# Patient Record
Sex: Female | Born: 1955 | Race: White | Hispanic: No | Smoking: Former smoker
Health system: Southern US, Community
[De-identification: ages and names within clinical notes are randomized; demographics above are authoritative.]

## PROBLEM LIST (undated history)

## (undated) DIAGNOSIS — G709 Myoneural disorder, unspecified: Secondary | ICD-10-CM

## (undated) DIAGNOSIS — K219 Gastro-esophageal reflux disease without esophagitis: Secondary | ICD-10-CM

## (undated) DIAGNOSIS — M1990 Unspecified osteoarthritis, unspecified site: Secondary | ICD-10-CM

## (undated) DIAGNOSIS — J449 Chronic obstructive pulmonary disease, unspecified: Secondary | ICD-10-CM

## (undated) DIAGNOSIS — F32A Depression, unspecified: Secondary | ICD-10-CM

## (undated) DIAGNOSIS — E119 Type 2 diabetes mellitus without complications: Secondary | ICD-10-CM

## (undated) DIAGNOSIS — I10 Essential (primary) hypertension: Secondary | ICD-10-CM

## (undated) DIAGNOSIS — Z87442 Personal history of urinary calculi: Secondary | ICD-10-CM

## (undated) DIAGNOSIS — H9190 Unspecified hearing loss, unspecified ear: Secondary | ICD-10-CM

## (undated) DIAGNOSIS — F419 Anxiety disorder, unspecified: Secondary | ICD-10-CM

## (undated) DIAGNOSIS — N189 Chronic kidney disease, unspecified: Secondary | ICD-10-CM

## (undated) DIAGNOSIS — J189 Pneumonia, unspecified organism: Secondary | ICD-10-CM

## (undated) HISTORY — PX: HERNIA REPAIR: SHX51

## (undated) HISTORY — PX: BACK SURGERY: SHX140

---

## 2019-09-26 ENCOUNTER — Emergency Department
Admission: EM | Admit: 2019-09-26 | Discharge: 2019-09-26 | Disposition: A | Discharge status: Home / Self Care | Payer: Self-pay | Attending: Emergency Medicine | Admitting: Emergency Medicine

## 2019-09-26 ENCOUNTER — Other Ambulatory Visit: Payer: Self-pay

## 2019-09-26 DIAGNOSIS — R1110 Vomiting, unspecified: Secondary | ICD-10-CM | POA: Insufficient documentation

## 2019-09-26 DIAGNOSIS — M545 Low back pain: Secondary | ICD-10-CM | POA: Insufficient documentation

## 2019-09-26 DIAGNOSIS — B9689 Other specified bacterial agents as the cause of diseases classified elsewhere: Secondary | ICD-10-CM | POA: Insufficient documentation

## 2019-09-26 DIAGNOSIS — N390 Urinary tract infection, site not specified: Secondary | ICD-10-CM | POA: Insufficient documentation

## 2019-09-26 DIAGNOSIS — Z5321 Procedure and treatment not carried out due to patient leaving prior to being seen by health care provider: Secondary | ICD-10-CM | POA: Insufficient documentation

## 2019-09-26 LAB — COMPREHENSIVE METABOLIC PANEL
ALT: 20 U/L (ref 0–44)
AST: 18 U/L (ref 15–41)
Albumin: 3 g/dL — ABNORMAL LOW (ref 3.5–5.0)
Alkaline Phosphatase: 106 U/L (ref 38–126)
Anion gap: 10 (ref 5–15)
BUN: 27 mg/dL — ABNORMAL HIGH (ref 8–23)
CO2: 23 mmol/L (ref 22–32)
Calcium: 8.7 mg/dL — ABNORMAL LOW (ref 8.9–10.3)
Chloride: 102 mmol/L (ref 98–111)
Creatinine, Ser: 1.18 mg/dL — ABNORMAL HIGH (ref 0.44–1.00)
GFR calc Af Amer: 56 mL/min — ABNORMAL LOW (ref >60)
GFR calc non Af Amer: 49 mL/min — ABNORMAL LOW (ref >60)
Glucose, Bld: 295 mg/dL — ABNORMAL HIGH (ref 70–99)
Potassium: 4.3 mmol/L (ref 3.5–5.1)
Sodium: 135 mmol/L (ref 135–145)
Total Bilirubin: 0.8 mg/dL (ref 0.3–1.2)
Total Protein: 6.3 g/dL — ABNORMAL LOW (ref 6.5–8.1)

## 2019-09-26 LAB — CBC
HCT: 39.4 % (ref 36.0–46.0)
Hemoglobin: 13.2 g/dL (ref 12.0–15.0)
MCH: 28.9 pg (ref 26.0–34.0)
MCHC: 33.5 g/dL (ref 30.0–36.0)
MCV: 86.2 fL (ref 80.0–100.0)
Platelets: 289 10*3/uL (ref 150–400)
RBC: 4.57 MIL/uL (ref 3.87–5.11)
RDW: 12.8 % (ref 11.5–15.5)
WBC: 9.6 10*3/uL (ref 4.0–10.5)
nRBC: 0 % (ref 0.0–0.2)

## 2019-09-26 NOTE — ED Triage Notes (Signed)
Pt complains of vomiting, right lower back pain for several days. Per pt's daughter she was started on a medication yesterday for a UTI and has been vomiting and "not acting like herself" since this am. Pt denies diarrhea, chest pain or shob.

## 2019-09-30 ENCOUNTER — Telehealth: Payer: Self-pay | Admitting: Emergency Medicine

## 2019-09-30 NOTE — Telephone Encounter (Signed)
Called patient due to lwot to inquire about condition and follow up plans.  Daughter says patient is doing much better.  Says she is looking better, gettign around and is not acting altered now.   No vomiting for several days now.   I told her to let her pcp know how she is doing and that labs are available.

## 2020-09-11 ENCOUNTER — Other Ambulatory Visit: Payer: Self-pay

## 2020-09-11 ENCOUNTER — Ambulatory Visit
Admission: RE | Admit: 2020-09-11 | Discharge: 2020-09-11 | Disposition: A | Discharge status: Home / Self Care | Payer: Medicare HMO | Source: Ambulatory Visit | Attending: Family Medicine | Admitting: Family Medicine

## 2020-09-11 ENCOUNTER — Other Ambulatory Visit: Payer: Self-pay | Admitting: Family Medicine

## 2020-09-11 DIAGNOSIS — K420 Umbilical hernia with obstruction, without gangrene: Secondary | ICD-10-CM | POA: Insufficient documentation

## 2020-11-02 ENCOUNTER — Other Ambulatory Visit: Payer: Self-pay | Admitting: Nephrology

## 2020-11-02 DIAGNOSIS — N183 Chronic kidney disease, stage 3 unspecified: Secondary | ICD-10-CM

## 2020-11-02 DIAGNOSIS — N2589 Other disorders resulting from impaired renal tubular function: Secondary | ICD-10-CM

## 2020-11-16 ENCOUNTER — Ambulatory Visit: Payer: Medicare HMO

## 2020-12-14 ENCOUNTER — Other Ambulatory Visit: Payer: Self-pay

## 2020-12-14 ENCOUNTER — Ambulatory Visit
Admission: RE | Admit: 2020-12-14 | Discharge: 2020-12-14 | Disposition: A | Discharge status: Home / Self Care | Payer: Medicare HMO | Source: Ambulatory Visit | Attending: Nephrology | Admitting: Nephrology

## 2020-12-14 DIAGNOSIS — N183 Chronic kidney disease, stage 3 unspecified: Secondary | ICD-10-CM | POA: Insufficient documentation

## 2020-12-14 DIAGNOSIS — N2589 Other disorders resulting from impaired renal tubular function: Secondary | ICD-10-CM | POA: Diagnosis present

## 2021-01-21 ENCOUNTER — Other Ambulatory Visit: Payer: Self-pay | Admitting: Orthopedic Surgery

## 2021-01-21 DIAGNOSIS — S42291K Other displaced fracture of upper end of right humerus, subsequent encounter for fracture with nonunion: Secondary | ICD-10-CM

## 2021-02-11 ENCOUNTER — Other Ambulatory Visit: Payer: Medicare HMO

## 2021-02-16 ENCOUNTER — Other Ambulatory Visit: Payer: Medicare HMO

## 2021-03-24 ENCOUNTER — Other Ambulatory Visit: Payer: Self-pay | Admitting: Orthopedic Surgery

## 2021-03-24 DIAGNOSIS — M19011 Primary osteoarthritis, right shoulder: Secondary | ICD-10-CM

## 2021-04-02 ENCOUNTER — Ambulatory Visit
Admission: RE | Admit: 2021-04-02 | Discharge: 2021-04-02 | Disposition: A | Discharge status: Home / Self Care | Payer: Medicare HMO | Source: Ambulatory Visit | Attending: Orthopedic Surgery | Admitting: Orthopedic Surgery

## 2021-04-02 ENCOUNTER — Other Ambulatory Visit: Payer: Self-pay

## 2021-04-02 DIAGNOSIS — M19011 Primary osteoarthritis, right shoulder: Secondary | ICD-10-CM

## 2021-04-16 NOTE — Patient Instructions (Addendum)
DUE TO COVID-19 ONLY ONE VISITOR IS ALLOWED TO COME WITH YOU AND STAY IN THE WAITING ROOM ONLY DURING PRE OP AND PROCEDURE.   **NO VISITORS ARE ALLOWED IN THE SHORT STAY AREA OR RECOVERY ROOM!!**  IF YOU WILL BE ADMITTED INTO THE HOSPITAL YOU ARE ALLOWED ONLY TWO SUPPORT PEOPLE DURING VISITATION HOURS ONLY (7 AM -8PM)   The support person(s) must pass our screening, gel in and out, and wear a mask at all times, including in the patients room. Patients must also wear a mask when staff or their support person are in the room. Visitors GUEST BADGE MUST BE WORN VISIBLY  One adult visitor may remain with you overnight and MUST be in the room by 8 P.M.  No visitors under the age of 66. Any visitor under the age of 66 must be accompanied by an adult.    You are not required to quarantine, however you are required to wear a well-fitted mask when you are out and around people not in your household.  Hand Hygiene often Do NOT share personal items Notify your provider if you are in close contact with someone who has COVID or you develop fever 100.4 or greater, new onset of sneezing, cough, sore throat, shortness of breath or body aches.        Your procedure is scheduled on: 05-08-22   [facility] to [facility] Main En[facility]    Report to admitting at      0600 AM   Call this number if you have problems the morning of surgery [phone]   Do not eat food :After Midnight.   After Midnight you may have the following liquids until    0545______ AM DAY OF SURGERY  Water Black Coffee (sugar ok, NO MILK/CREAM OR CREAMERS)  Tea (sugar ok, NO MILK/CREAM OR CREAMERS) regular and decaf                             Plain Jell-O (NO RED)                                           Fruit ices (not with fruit pulp, NO RED)                                     Popsicles (NO RED)                                                                  Juice: apple, WHITE grape, WHITE cranberry Sports drinks like  Gatorade (NO RED) Clear broth(vegetable,chicken,beef)                   The day of surgery:  Drink ONE (1) Pre-Surgery G2 at       0545 AM the morning of surgery. Drink in one sitting. Do not sip.  This drink was given to you during your hospital  pre-op appointment visit. Nothing else to drink after completing the  Pre-Surgery Clear Ensure or G2.  If you have questions, please contact your surgeons office.       Oral Hygiene is also important to reduce your risk of infection.                                    Remember - BRUSH YOUR TEETH THE MORNING OF SURGERY WITH YOUR REGULAR TOOTHPASTE   Do NOT smoke after Midnight   Take these medicines the morning of surgery with A SIP OF WATER: Amlodipine, inhalers and bring them with you, carvedilol, citalopram, duloxentine, gabapentin,   DO NOT TAKE ANY ORAL DIABETIC MEDICATIONS DAY OF YOUR SURGERY  Bring CPAP mask and tubing day of surgery.                              You may not have any metal on your body including hair pins, jewelry, and body piercing             Do not wear make-up, lotions, powders, perfumes/cologne, or deodorant  Do not wear nail polish including gel and S&S, artificial/acrylic nails, or any other type of covering on natural nails including finger and toenails. If you have artificial nails, gel coating, etc. that needs to be removed by a nail salon please have this removed prior to surgery or surgery may need to be canceled/ delayed if the surgeon/ anesthesia feels like they are unable to be safely monitored.   Do not shave  48 hours prior to surgery.               Men may shave face and neck.   Do not bring valuables to the hospital. [facility] IS NOT             RESPONSIBLE   FOR VALUABLES.   Contacts, dentures or bridgework may not be worn into surgery.   Bring small overnight bag day of surgery.    Patients discharged on the day of surgery will not be allowed to drive home.  Someone NEEDS  to stay with you for the first 24 hours after anesthesia.   Special Instructions: Bring a copy of your healthcare power of attorney and living will documents         the day of surgery if you haven't scanned them before.              Please read over the following fact sheets you were given: IF YOU HAVE QUESTIONS ABOUT YOUR PRE-OP INSTRUCTIONS PLEASE CALL [phone]  [place]- Preparing for Total Shoulder Arthroplasty    Before surgery, you can play an important role. Because skin is not sterile, your skin needs to be as free of germs as possible. You can reduce the number of germs on your skin by using the following products. Benzoyl Peroxide Gel Reduces the number of germs present on the skin Applied twice a day to shoulder area starting two days before surgery    ==================================================================  Please follow these instructions carefully:  BENZOYL PEROXIDE 5% GEL  Please do not use if you have an allergy to benzoyl peroxide.   If your skin becomes reddened/irritated stop using the benzoyl peroxide.  Starting two days before surgery, apply as follows: Apply benzoyl peroxide in the morning and at night. Apply after taking a shower. If you are not taking a shower clean entire shoulder front, back, and side along with  the armpit with a clean wet washcloth.  Place a quarter-sized dollop on your shoulder and rub in thoroughly, making sure to cover the front, back, and side of your shoulder, along with the armpit.   2 days before ____ AM   ____ PM              1 day before ____ AM   ____ PM                         Do this twice a day for two days.  (Last application is the night before surgery, AFTER using the CHG soap as described below).  Do NOT apply benzoyl peroxide gel on the day of surgery.       [place] - Preparing for Surgery Before surgery, you can play an important role.  Because skin is not sterile, your skin needs to be as free of  germs as possible.  You can reduce the number of germs on your skin by washing with CHG (chlorahexidine gluconate) soap before surgery.  CHG is an antiseptic cleaner which kills germs and bonds with the skin to continue killing germs even after washing. Please DO NOT use if you have an allergy to CHG or antibacterial soaps.  If your skin becomes reddened/irritated stop using the CHG and inform your nurse when you arrive at Short Stay. Do not shave (including legs and underarms) for at least 48 hours prior to the first CHG shower.  You may shave your face/neck. Please follow these instructions carefully:  1.  Shower with CHG Soap the night before surgery and the  morning of Surgery.  2.  If you choose to wash your hair, wash your hair first as usual with your  normal  shampoo.  3.  After you shampoo, rinse your hair and body thoroughly to remove the  shampoo.                           4.  Use CHG as you would any other liquid soap.  You can apply chg directly  to the skin and wash                       Gently with a scrungie or clean washcloth.  5.  Apply the CHG Soap to your body ONLY FROM THE NECK DOWN.   Do not use on face/ open                           Wound or open sores. Avoid contact with eyes, ears mouth and genitals (private parts).                       Wash face,  Genitals (private parts) with your normal soap.             6.  Wash thoroughly, paying special attention to the area where your surgery  will be performed.  7.  Thoroughly rinse your body with warm water from the neck down.  8.  DO NOT shower/wash with your normal soap after using and rinsing off  the CHG Soap.                9.  Pat yourself dry with a clean towel.            10.  Wear clean  pajamas.            11.  Place clean sheets on your bed the night of your first shower and do not  sleep with pets. Day of Surgery : Do not apply any lotions/deodorants the morning of surgery.  Please wear clean clothes to the  hospital/surgery center.  FAILURE TO FOLLOW THESE INSTRUCTIONS MAY RESULT IN THE CANCELLATION OF YOUR SURGERY PATIENT SIGNATURE_________________________________  NURSE SIGNATURE__________________________________  ________________________________________________________________________   Rogelia MireIncentive Spirometer  An incentive spirometer is a tool that can help keep your lungs clear and active. This tool measures how well you are filling your lungs with each breath. Taking long deep breaths may help reverse or decrease the chance of developing breathing (pulmonary) problems (especially infection) following: A long period of time when you are unable to move or be active. BEFORE THE PROCEDURE  If the spirometer includes an indicator to show your best effort, your nurse or respiratory therapist will set it to a desired goal. If possible, sit up straight or lean slightly forward. Try not to slouch. Hold the incentive spirometer in an upright position. INSTRUCTIONS FOR USE  Sit on the edge of your bed if possible, or sit up as far as you can in bed or on a chair. Hold the incentive spirometer in an upright position. Breathe out normally. Place the mouthpiece in your mouth and seal your lips tightly around it. Breathe in slowly and as deeply as possible, raising the piston or the ball toward the top of the column. Hold your breath for 3-5 seconds or for as long as possible. Allow the piston or ball to fall to the bottom of the column. Remove the mouthpiece from your mouth and breathe out normally. Rest for a few seconds and repeat Steps 1 through 7 at least 10 times every 1-2 hours when you are awake. Take your time and take a few normal breaths between deep breaths. The spirometer may include an indicator to show your best effort. Use the indicator as a goal to work toward during each repetition. After each set of 10 deep breaths, practice coughing to be sure your lungs are clear. If you have an  incision (the cut made at the time of surgery), support your incision when coughing by placing a pillow or rolled up towels firmly against it. Once you are able to get out of bed, walk around indoors and cough well. You may stop using the incentive spirometer when instructed by your caregiver.  RISKS AND COMPLICATIONS Take your time so you do not get dizzy or light-headed. If you are in pain, you may need to take or ask for pain medication before doing incentive spirometry. It is harder to take a deep breath if you are having pain. AFTER USE Rest and breathe slowly and easily. It can be helpful to keep track of a log of your progress. Your caregiver can provide you with a simple table to help with this. If you are using the spirometer at home, follow these instructions: SEEK MEDICAL CARE IF:  You are having difficultly using the spirometer. You have trouble using the spirometer as often as instructed. Your pain medication is not giving enough relief while using the spirometer. You develop fever of 100.5 F (38.1 C) or higher. SEEK IMMEDIATE MEDICAL CARE IF:  You cough up bloody sputum that had not been present before. You develop fever of 102 F (38.9 C) or greater. You develop worsening pain at or near the incision site.  MAKE SURE YOU:  Understand these instructions. Will watch your condition. Will get help right away if you are not doing well or get worse. Document Released: 06/13/2006 Document Revised: 04/25/2011 Document Reviewed: 08/14/2006 [facility] Patient Information 2014 [place], [place].  How to Manage Your Diabetes Before and After Surgery  Why is it important to control my blood sugar before and after surgery? Improving blood sugar levels before and after surgery helps healing and can limit problems. A way of improving blood sugar control is eating a healthy diet by:  Eating less sugar and carbohydrates  Increasing activity/exercise  Talking with your doctor about  reaching your blood sugar goals High blood sugars (greater than 180 mg/dL) can raise your risk of infections and slow your recovery, so you will need to focus on controlling your diabetes during the weeks before surgery. Make sure that the doctor who takes care of your diabetes knows about your planned surgery including the date and location.  How do I manage my blood sugar before surgery? Check your blood sugar at least 4 times a day, starting 2 days before surgery, to make sure that the level is not too high or low. Check your blood sugar the morning of your surgery when you wake up and every 2 hours until you get to the Short Stay unit. If your blood sugar is less than 70 mg/dL, you will need to treat for low blood sugar: Do not take insulin. Treat a low blood sugar (less than 70 mg/dL) with  cup of clear juice (cranberry or apple), 4 glucose tablets, OR glucose gel. Recheck blood sugar in 15 minutes after treatment (to make sure it is greater than 70 mg/dL). If your blood sugar is not greater than 70 mg/dL on recheck, call [phone] for further instructions. Report your blood sugar to the short stay nurse when you get to Short Stay.  If you are admitted to the hospital after surgery: Your blood sugar will be checked by the staff and you will probably be given insulin after surgery (instead of oral diabetes medicines) to make sure you have good blood sugar levels. The goal for blood sugar control after surgery is 80-180 mg/dL.   WHAT DO I DO ABOUT MY DIABETES MEDICATION?  Do not take oral diabetes medicines (pills) the morning of surgery.  The day of surgery, do not take other diabetes injectables, including Byetta (exenatide), Bydureon (exenatide ER), Victoza (liraglutide), NO Trulicity (dulaglutide). NO  Joseph Berkshire  If your CBG is greater than 220 mg/dL, you may take  of your sliding scale  (correction) dose of insulin.      ________________________________________________________________________

## 2021-04-16 NOTE — Progress Notes (Addendum)
PCP - Jone Baseman Paschall  ?Cardiologist - no ?Nephrology-02-17-20 epic Corbin Ade, MD  Clearance on chart ?PULM- LOV 11-30-20 epic Dr. Stark Jock ? ?PPM/ICD -  ?Device Orders -  ?Rep Notified -  ? ?Chest x-ray - CTA chest 11-11-21 (CE) ?EKG - 11-11-20 on chart ?Stress Test -  ?ECHO -  ?Cardiac Cath -  ? ?Sleep Study -  ?CPAP -  ? ?Fasting Blood Sugar - 81 ?Checks Blood Sugar __1___ times a day ? ?Blood Thinner Instructions: ?Aspirin Instructions: ? ?ERAS Protcol - ?PRE-SURGERY G2-  ? ?COVID TEST- N/A ?COVID vaccine -x2 + booster x 1 Pfizer ? ?Activity--Not able to walk a flight of stairs without SOB due to COPD It has improved Able to complete ADL's without SOB  ? ?Anesthesia review: CKDIII, DM, COPD, HTN ?Creat. 1.60, K 5.5 ? ?Patient denies shortness of breath, fever, cough and chest pain at PAT appointment ? ? ?All instructions explained to the patient, with a verbal understanding of the material. Patient agrees to go over the instructions while at home for a better understanding. Patient also instructed to self quarantine after being tested for COVID-19. The opportunity to ask questions was provided. ?  ?

## 2021-04-23 ENCOUNTER — Encounter (HOSPITAL_COMMUNITY): Payer: Self-pay

## 2021-04-23 ENCOUNTER — Other Ambulatory Visit: Payer: Self-pay

## 2021-04-23 ENCOUNTER — Encounter (HOSPITAL_COMMUNITY)
Admission: RE | Admit: 2021-04-23 | Discharge: 2021-04-23 | Disposition: A | Discharge status: Home / Self Care | Payer: Medicare HMO | Source: Ambulatory Visit | Attending: Orthopedic Surgery | Admitting: Orthopedic Surgery

## 2021-04-23 VITALS — BP 150/60 | HR 66 | Temp 99.2°F | Resp 16 | Ht 63.75 in | Wt 182.0 lb

## 2021-04-23 DIAGNOSIS — Z01812 Encounter for preprocedural laboratory examination: Secondary | ICD-10-CM | POA: Diagnosis present

## 2021-04-23 DIAGNOSIS — I10 Essential (primary) hypertension: Secondary | ICD-10-CM | POA: Insufficient documentation

## 2021-04-23 DIAGNOSIS — E1369 Other specified diabetes mellitus with other specified complication: Secondary | ICD-10-CM | POA: Diagnosis not present

## 2021-04-23 DIAGNOSIS — Z01818 Encounter for other preprocedural examination: Secondary | ICD-10-CM

## 2021-04-23 HISTORY — DX: Personal history of urinary calculi: Z87.442

## 2021-04-23 HISTORY — DX: Myoneural disorder, unspecified: G70.9

## 2021-04-23 HISTORY — DX: Depression, unspecified: F32.A

## 2021-04-23 HISTORY — DX: Anxiety disorder, unspecified: F41.9

## 2021-04-23 HISTORY — DX: Type 2 diabetes mellitus without complications: E11.9

## 2021-04-23 HISTORY — DX: Gastro-esophageal reflux disease without esophagitis: K21.9

## 2021-04-23 HISTORY — DX: Chronic obstructive pulmonary disease, unspecified: J44.9

## 2021-04-23 HISTORY — DX: Unspecified osteoarthritis, unspecified site: M19.90

## 2021-04-23 HISTORY — DX: Essential (primary) hypertension: I10

## 2021-04-23 HISTORY — DX: Pneumonia, unspecified organism: J18.9

## 2021-04-23 HISTORY — DX: Chronic kidney disease, unspecified: N18.9

## 2021-04-23 HISTORY — DX: Unspecified hearing loss, unspecified ear: H91.90

## 2021-04-23 LAB — SURGICAL PCR SCREEN
MRSA, PCR: NEGATIVE
Staphylococcus aureus: NEGATIVE

## 2021-04-23 LAB — GLUCOSE, CAPILLARY: Glucose-Capillary: 95 mg/dL (ref 70–99)

## 2021-04-23 LAB — CBC
HCT: 38.1 % (ref 36.0–46.0)
Hemoglobin: 12 g/dL (ref 12.0–15.0)
MCH: 29 pg (ref 26.0–34.0)
MCHC: 31.5 g/dL (ref 30.0–36.0)
MCV: 92 fL (ref 80.0–100.0)
Platelets: 282 10*3/uL (ref 150–400)
RBC: 4.14 MIL/uL (ref 3.87–5.11)
RDW: 14.2 % (ref 11.5–15.5)
WBC: 10.5 10*3/uL (ref 4.0–10.5)
nRBC: 0 % (ref 0.0–0.2)

## 2021-04-23 LAB — BASIC METABOLIC PANEL
Anion gap: 8 (ref 5–15)
BUN: 41 mg/dL — ABNORMAL HIGH (ref 8–23)
CO2: 24 mmol/L (ref 22–32)
Calcium: 9.3 mg/dL (ref 8.9–10.3)
Chloride: 106 mmol/L (ref 98–111)
Creatinine, Ser: 1.61 mg/dL — ABNORMAL HIGH (ref 0.44–1.00)
GFR, Estimated: 35 mL/min — ABNORMAL LOW (ref >60)
Glucose, Bld: 92 mg/dL (ref 70–99)
Potassium: 5.5 mmol/L — ABNORMAL HIGH (ref 3.5–5.1)
Sodium: 138 mmol/L (ref 135–145)

## 2021-04-24 LAB — HEMOGLOBIN A1C
Hgb A1c MFr Bld: 6.5 % — ABNORMAL HIGH (ref 4.8–5.6)
Mean Plasma Glucose: 140 mg/dL

## 2021-05-07 ENCOUNTER — Other Ambulatory Visit: Payer: Self-pay

## 2021-05-07 ENCOUNTER — Ambulatory Visit (HOSPITAL_COMMUNITY): Payer: Medicare HMO | Admitting: Physician Assistant

## 2021-05-07 ENCOUNTER — Observation Stay (HOSPITAL_COMMUNITY): Payer: Medicare HMO

## 2021-05-07 ENCOUNTER — Inpatient Hospital Stay (HOSPITAL_COMMUNITY)
Admission: RE | Admit: 2021-05-07 | Discharge: 2021-05-11 | DRG: 483 | Disposition: A | Discharge status: Home / Self Care | Payer: Medicare HMO | Source: Ambulatory Visit | Attending: Orthopedic Surgery | Admitting: Orthopedic Surgery

## 2021-05-07 ENCOUNTER — Encounter (HOSPITAL_COMMUNITY)
Admission: RE | Disposition: A | Discharge status: Home / Self Care | Payer: Self-pay | Source: Ambulatory Visit | Attending: Orthopedic Surgery

## 2021-05-07 ENCOUNTER — Encounter (HOSPITAL_COMMUNITY): Payer: Self-pay | Admitting: Orthopedic Surgery

## 2021-05-07 DIAGNOSIS — N183 Chronic kidney disease, stage 3 unspecified: Secondary | ICD-10-CM

## 2021-05-07 DIAGNOSIS — E1122 Type 2 diabetes mellitus with diabetic chronic kidney disease: Secondary | ICD-10-CM | POA: Diagnosis present

## 2021-05-07 DIAGNOSIS — Z87891 Personal history of nicotine dependence: Secondary | ICD-10-CM

## 2021-05-07 DIAGNOSIS — R443 Hallucinations, unspecified: Secondary | ICD-10-CM | POA: Diagnosis not present

## 2021-05-07 DIAGNOSIS — S42201K Unspecified fracture of upper end of right humerus, subsequent encounter for fracture with nonunion: Principal | ICD-10-CM

## 2021-05-07 DIAGNOSIS — E669 Obesity, unspecified: Secondary | ICD-10-CM | POA: Diagnosis present

## 2021-05-07 DIAGNOSIS — Z794 Long term (current) use of insulin: Secondary | ICD-10-CM

## 2021-05-07 DIAGNOSIS — E875 Hyperkalemia: Secondary | ICD-10-CM | POA: Diagnosis not present

## 2021-05-07 DIAGNOSIS — Z79899 Other long term (current) drug therapy: Secondary | ICD-10-CM

## 2021-05-07 DIAGNOSIS — I129 Hypertensive chronic kidney disease with stage 1 through stage 4 chronic kidney disease, or unspecified chronic kidney disease: Secondary | ICD-10-CM | POA: Diagnosis present

## 2021-05-07 DIAGNOSIS — J449 Chronic obstructive pulmonary disease, unspecified: Secondary | ICD-10-CM | POA: Diagnosis present

## 2021-05-07 DIAGNOSIS — K219 Gastro-esophageal reflux disease without esophagitis: Secondary | ICD-10-CM | POA: Diagnosis present

## 2021-05-07 DIAGNOSIS — Z885 Allergy status to narcotic agent status: Secondary | ICD-10-CM

## 2021-05-07 DIAGNOSIS — Z6831 Body mass index (BMI) 31.0-31.9, adult: Secondary | ICD-10-CM

## 2021-05-07 DIAGNOSIS — F32A Depression, unspecified: Secondary | ICD-10-CM | POA: Diagnosis present

## 2021-05-07 DIAGNOSIS — Z7951 Long term (current) use of inhaled steroids: Secondary | ICD-10-CM

## 2021-05-07 DIAGNOSIS — N179 Acute kidney failure, unspecified: Secondary | ICD-10-CM | POA: Diagnosis present

## 2021-05-07 DIAGNOSIS — N1832 Chronic kidney disease, stage 3b: Secondary | ICD-10-CM | POA: Diagnosis present

## 2021-05-07 DIAGNOSIS — F419 Anxiety disorder, unspecified: Secondary | ICD-10-CM | POA: Diagnosis present

## 2021-05-07 DIAGNOSIS — M1990 Unspecified osteoarthritis, unspecified site: Secondary | ICD-10-CM | POA: Diagnosis present

## 2021-05-07 DIAGNOSIS — E1140 Type 2 diabetes mellitus with diabetic neuropathy, unspecified: Secondary | ICD-10-CM | POA: Diagnosis present

## 2021-05-07 DIAGNOSIS — Z96611 Presence of right artificial shoulder joint: Principal | ICD-10-CM

## 2021-05-07 HISTORY — PX: REVERSE SHOULDER ARTHROPLASTY: SHX5054

## 2021-05-07 LAB — GLUCOSE, CAPILLARY
Glucose-Capillary: 50 mg/dL — ABNORMAL LOW (ref 70–99)
Glucose-Capillary: 82 mg/dL (ref 70–99)
Glucose-Capillary: 77 mg/dL (ref 70–99)
Glucose-Capillary: 107 mg/dL — ABNORMAL HIGH (ref 70–99)
Glucose-Capillary: 252 mg/dL — ABNORMAL HIGH (ref 70–99)
Glucose-Capillary: 393 mg/dL — ABNORMAL HIGH (ref 70–99)

## 2021-05-07 LAB — CBC
HCT: 33.3 % — ABNORMAL LOW (ref 36.0–46.0)
Hemoglobin: 10.4 g/dL — ABNORMAL LOW (ref 12.0–15.0)
MCH: 29.1 pg (ref 26.0–34.0)
MCHC: 31.2 g/dL (ref 30.0–36.0)
MCV: 93 fL (ref 80.0–100.0)
Platelets: 207 10*3/uL (ref 150–400)
RBC: 3.58 MIL/uL — ABNORMAL LOW (ref 3.87–5.11)
RDW: 14.4 % (ref 11.5–15.5)
WBC: 16.4 10*3/uL — ABNORMAL HIGH (ref 4.0–10.5)
nRBC: 0 % (ref 0.0–0.2)

## 2021-05-07 LAB — CREATININE, SERUM
Creatinine, Ser: 1.54 mg/dL — ABNORMAL HIGH (ref 0.44–1.00)
GFR, Estimated: 37 mL/min — ABNORMAL LOW (ref >60)

## 2021-05-07 SURGERY — ARTHROPLASTY, SHOULDER, TOTAL, REVERSE
Anesthesia: General | Site: Shoulder | Laterality: Right

## 2021-05-07 MED ORDER — SUGAMMADEX SODIUM 200 MG/2ML IV SOLN
INTRAVENOUS | Status: DC | PRN
  Administered 2021-05-07: 170 mg via INTRAVENOUS

## 2021-05-07 MED ORDER — DEXTROSE 50 % IV SOLN
0.5000 | Freq: Once | INTRAVENOUS | Status: AC
  Administered 2021-05-07: 25 mL via INTRAVENOUS

## 2021-05-07 MED ORDER — MIDAZOLAM HCL 2 MG/2ML IJ SOLN
1.0000 mg | INTRAMUSCULAR | Status: DC
  Administered 2021-05-07: 2 mg via INTRAVENOUS
  Filled 2021-05-07: qty 2

## 2021-05-07 MED ORDER — INSULIN ASPART 100 UNIT/ML IJ SOLN
0.0000 [IU] | Freq: Every day | INTRAMUSCULAR | Status: DC
  Administered 2021-05-07: 5 [IU] via SUBCUTANEOUS

## 2021-05-07 MED ORDER — HYDROCODONE-ACETAMINOPHEN 5-325 MG PO TABS
1.0000 | ORAL_TABLET | ORAL | Status: DC | PRN
  Administered 2021-05-07 (×2): 2 via ORAL
  Administered 2021-05-08 – 2021-05-11 (×2): 1 via ORAL
  Filled 2021-05-07: qty 1
  Filled 2021-05-07: qty 2
  Filled 2021-05-07: qty 1
  Filled 2021-05-07: qty 2

## 2021-05-07 MED ORDER — PHENOL 1.4 % MT LIQD: 1.0000 | OROMUCOSAL | Status: DC | PRN

## 2021-05-07 MED ORDER — ORAL CARE MOUTH RINSE: 15.0000 mL | Freq: Once | OROMUCOSAL | Status: AC

## 2021-05-07 MED ORDER — FENTANYL CITRATE (PF) 100 MCG/2ML IJ SOLN
INTRAMUSCULAR | Status: DC | PRN
  Administered 2021-05-07: 100 ug via INTRAVENOUS

## 2021-05-07 MED ORDER — ACETAMINOPHEN 10 MG/ML IV SOLN: 1000.0000 mg | Freq: Once | INTRAVENOUS | Status: DC | PRN

## 2021-05-07 MED ORDER — ONDANSETRON HCL 4 MG PO TABS: 4.0000 mg | ORAL_TABLET | Freq: Three times a day (TID) | ORAL | 0 refills | Status: AC | PRN

## 2021-05-07 MED ORDER — PROPOFOL 10 MG/ML IV BOLUS
INTRAVENOUS | Status: AC
  Filled 2021-05-07: qty 20

## 2021-05-07 MED ORDER — FENTANYL CITRATE PF 50 MCG/ML IJ SOSY: 25.0000 ug | PREFILLED_SYRINGE | INTRAMUSCULAR | Status: DC | PRN

## 2021-05-07 MED ORDER — PHENYLEPHRINE HCL-NACL 20-0.9 MG/250ML-% IV SOLN
INTRAVENOUS | Status: DC | PRN
  Administered 2021-05-07: 25 ug/min via INTRAVENOUS

## 2021-05-07 MED ORDER — METOCLOPRAMIDE HCL 5 MG/ML IJ SOLN: 5.0000 mg | Freq: Three times a day (TID) | INTRAMUSCULAR | Status: DC | PRN

## 2021-05-07 MED ORDER — GABAPENTIN 300 MG PO CAPS
300.0000 mg | ORAL_CAPSULE | Freq: Three times a day (TID) | ORAL | Status: DC
  Administered 2021-05-07 – 2021-05-11 (×11): 300 mg via ORAL
  Filled 2021-05-07 (×11): qty 1

## 2021-05-07 MED ORDER — CEFAZOLIN SODIUM-DEXTROSE 1-4 GM/50ML-% IV SOLN
1.0000 g | Freq: Four times a day (QID) | INTRAVENOUS | Status: AC
  Administered 2021-05-07 – 2021-05-08 (×3): 1 g via INTRAVENOUS
  Filled 2021-05-07 (×3): qty 50

## 2021-05-07 MED ORDER — ARFORMOTEROL TARTRATE 15 MCG/2ML IN NEBU
15.0000 ug | INHALATION_SOLUTION | Freq: Two times a day (BID) | RESPIRATORY_TRACT | Status: DC
  Administered 2021-05-07 – 2021-05-11 (×8): 15 ug via RESPIRATORY_TRACT
  Filled 2021-05-07 (×8): qty 2

## 2021-05-07 MED ORDER — LACTATED RINGERS IV SOLN: INTRAVENOUS | Status: DC

## 2021-05-07 MED ORDER — EPHEDRINE SULFATE-NACL 50-0.9 MG/10ML-% IV SOSY
PREFILLED_SYRINGE | INTRAVENOUS | Status: DC | PRN
  Administered 2021-05-07: 10 mg via INTRAVENOUS
  Administered 2021-05-07 (×3): 5 mg via INTRAVENOUS

## 2021-05-07 MED ORDER — ONDANSETRON HCL 4 MG/2ML IJ SOLN
4.0000 mg | Freq: Four times a day (QID) | INTRAMUSCULAR | Status: DC | PRN
  Administered 2021-05-07 – 2021-05-08 (×2): 4 mg via INTRAVENOUS
  Filled 2021-05-07 (×2): qty 2

## 2021-05-07 MED ORDER — EMPAGLIFLOZIN 10 MG PO TABS
10.0000 mg | ORAL_TABLET | Freq: Every day | ORAL | Status: DC
  Filled 2021-05-07: qty 1

## 2021-05-07 MED ORDER — ONDANSETRON HCL 4 MG/2ML IJ SOLN
INTRAMUSCULAR | Status: DC | PRN
  Administered 2021-05-07: 4 mg via INTRAVENOUS

## 2021-05-07 MED ORDER — ACETAMINOPHEN 325 MG PO TABS
325.0000 mg | ORAL_TABLET | Freq: Four times a day (QID) | ORAL | Status: DC | PRN
  Administered 2021-05-10 (×2): 650 mg via ORAL
  Filled 2021-05-07 (×2): qty 2

## 2021-05-07 MED ORDER — TRANEXAMIC ACID-NACL 1000-0.7 MG/100ML-% IV SOLN
1000.0000 mg | INTRAVENOUS | Status: AC
  Administered 2021-05-07: 1000 mg via INTRAVENOUS
  Filled 2021-05-07: qty 100

## 2021-05-07 MED ORDER — MORPHINE SULFATE (PF) 2 MG/ML IV SOLN
0.5000 mg | INTRAVENOUS | Status: DC | PRN
  Administered 2021-05-08: 0.5 mg via INTRAVENOUS
  Filled 2021-05-07: qty 1

## 2021-05-07 MED ORDER — METHOCARBAMOL 500 MG PO TABS
1000.0000 mg | ORAL_TABLET | Freq: Every day | ORAL | Status: DC
  Administered 2021-05-07 – 2021-05-10 (×3): 1000 mg via ORAL
  Filled 2021-05-07 (×3): qty 2

## 2021-05-07 MED ORDER — VANCOMYCIN HCL 1000 MG IV SOLR
INTRAVENOUS | Status: AC
  Filled 2021-05-07: qty 20

## 2021-05-07 MED ORDER — OXYCODONE HCL 5 MG PO TABS: 5.0000 mg | ORAL_TABLET | Freq: Three times a day (TID) | ORAL | 0 refills | Status: AC | PRN

## 2021-05-07 MED ORDER — ONDANSETRON HCL 4 MG/2ML IJ SOLN: 4.0000 mg | Freq: Once | INTRAMUSCULAR | Status: DC | PRN

## 2021-05-07 MED ORDER — CHLORHEXIDINE GLUCONATE 0.12 % MT SOLN
15.0000 mL | Freq: Once | OROMUCOSAL | Status: AC
  Administered 2021-05-07: 15 mL via OROMUCOSAL

## 2021-05-07 MED ORDER — ALBUTEROL SULFATE (2.5 MG/3ML) 0.083% IN NEBU: 2.5000 mg | INHALATION_SOLUTION | Freq: Four times a day (QID) | RESPIRATORY_TRACT | Status: DC | PRN

## 2021-05-07 MED ORDER — METOCLOPRAMIDE HCL 5 MG PO TABS
5.0000 mg | ORAL_TABLET | Freq: Three times a day (TID) | ORAL | Status: DC | PRN
  Administered 2021-05-07: 5 mg via ORAL
  Filled 2021-05-07: qty 1

## 2021-05-07 MED ORDER — BUDESONIDE 0.25 MG/2ML IN SUSP
0.2500 mg | Freq: Two times a day (BID) | RESPIRATORY_TRACT | Status: DC
  Administered 2021-05-07 – 2021-05-11 (×8): 0.25 mg via RESPIRATORY_TRACT
  Filled 2021-05-07 (×8): qty 2

## 2021-05-07 MED ORDER — PHENYLEPHRINE 40 MCG/ML (10ML) SYRINGE FOR IV PUSH (FOR BLOOD PRESSURE SUPPORT)
PREFILLED_SYRINGE | INTRAVENOUS | Status: AC
  Filled 2021-05-07: qty 10

## 2021-05-07 MED ORDER — FENTANYL CITRATE (PF) 100 MCG/2ML IJ SOLN
INTRAMUSCULAR | Status: AC
  Filled 2021-05-07: qty 2

## 2021-05-07 MED ORDER — CARVEDILOL 25 MG PO TABS
25.0000 mg | ORAL_TABLET | Freq: Two times a day (BID) | ORAL | Status: DC
  Administered 2021-05-07 – 2021-05-09 (×4): 25 mg via ORAL
  Filled 2021-05-07 (×4): qty 1

## 2021-05-07 MED ORDER — STERILE WATER FOR IRRIGATION IR SOLN
Status: DC | PRN
  Administered 2021-05-07: 1000 mL

## 2021-05-07 MED ORDER — CARVEDILOL 25 MG PO TABS
25.0000 mg | ORAL_TABLET | Freq: Once | ORAL | Status: AC
  Administered 2021-05-07: 25 mg via ORAL
  Filled 2021-05-07: qty 1

## 2021-05-07 MED ORDER — UMECLIDINIUM BROMIDE 62.5 MCG/ACT IN AEPB
1.0000 | INHALATION_SPRAY | Freq: Every day | RESPIRATORY_TRACT | Status: DC
  Administered 2021-05-08 – 2021-05-11 (×4): 1 via RESPIRATORY_TRACT
  Filled 2021-05-07: qty 7

## 2021-05-07 MED ORDER — DEXAMETHASONE SODIUM PHOSPHATE 10 MG/ML IJ SOLN
INTRAMUSCULAR | Status: DC | PRN
  Administered 2021-05-07: 4 mg via INTRAVENOUS

## 2021-05-07 MED ORDER — ACETAMINOPHEN 500 MG PO TABS
500.0000 mg | ORAL_TABLET | Freq: Four times a day (QID) | ORAL | Status: AC
  Administered 2021-05-08 (×2): 500 mg via ORAL
  Filled 2021-05-07 (×3): qty 1

## 2021-05-07 MED ORDER — 0.9 % SODIUM CHLORIDE (POUR BTL) OPTIME
TOPICAL | Status: DC | PRN
  Administered 2021-05-07: 1000 mL

## 2021-05-07 MED ORDER — PROPOFOL 10 MG/ML IV BOLUS
INTRAVENOUS | Status: DC | PRN
  Administered 2021-05-07: 100 mg via INTRAVENOUS

## 2021-05-07 MED ORDER — ROPIVACAINE HCL 7.5 MG/ML IJ SOLN
INTRAMUSCULAR | Status: DC | PRN
  Administered 2021-05-07: 20 mL via PERINEURAL

## 2021-05-07 MED ORDER — TRANEXAMIC ACID-NACL 1000-0.7 MG/100ML-% IV SOLN
1000.0000 mg | Freq: Once | INTRAVENOUS | Status: AC
  Administered 2021-05-07: 1000 mg via INTRAVENOUS
  Filled 2021-05-07: qty 100

## 2021-05-07 MED ORDER — LIDOCAINE 2% (20 MG/ML) 5 ML SYRINGE
INTRAMUSCULAR | Status: DC | PRN
Start: 2021-05-07 — End: 2021-05-07
  Administered 2021-05-07: 40 mg via INTRAVENOUS

## 2021-05-07 MED ORDER — AMISULPRIDE (ANTIEMETIC) 5 MG/2ML IV SOLN: 10.0000 mg | Freq: Once | INTRAVENOUS | Status: DC | PRN

## 2021-05-07 MED ORDER — DOCUSATE SODIUM 100 MG PO CAPS
100.0000 mg | ORAL_CAPSULE | Freq: Two times a day (BID) | ORAL | Status: DC
  Administered 2021-05-07 – 2021-05-11 (×8): 100 mg via ORAL
  Filled 2021-05-07 (×8): qty 1

## 2021-05-07 MED ORDER — ONDANSETRON HCL 4 MG/2ML IJ SOLN
INTRAMUSCULAR | Status: AC
  Filled 2021-05-07: qty 2

## 2021-05-07 MED ORDER — MENTHOL 3 MG MT LOZG: 1.0000 | LOZENGE | OROMUCOSAL | Status: DC | PRN

## 2021-05-07 MED ORDER — ONDANSETRON HCL 4 MG PO TABS
4.0000 mg | ORAL_TABLET | Freq: Four times a day (QID) | ORAL | Status: DC | PRN
  Administered 2021-05-08 – 2021-05-09 (×3): 4 mg via ORAL
  Filled 2021-05-07 (×3): qty 1

## 2021-05-07 MED ORDER — ENOXAPARIN SODIUM 40 MG/0.4ML IJ SOSY
40.0000 mg | PREFILLED_SYRINGE | INTRAMUSCULAR | Status: DC
  Administered 2021-05-08 – 2021-05-09 (×2): 40 mg via SUBCUTANEOUS
  Filled 2021-05-07 (×2): qty 0.4

## 2021-05-07 MED ORDER — HYDROCODONE-ACETAMINOPHEN 7.5-325 MG PO TABS
1.0000 | ORAL_TABLET | ORAL | Status: DC | PRN
  Administered 2021-05-08: 2 via ORAL
  Administered 2021-05-08 (×3): 1 via ORAL
  Administered 2021-05-09 (×3): 2 via ORAL
  Filled 2021-05-07 (×2): qty 2
  Filled 2021-05-07: qty 1
  Filled 2021-05-07 (×2): qty 2
  Filled 2021-05-07 (×2): qty 1
  Filled 2021-05-07: qty 2

## 2021-05-07 MED ORDER — CITALOPRAM HYDROBROMIDE 20 MG PO TABS
40.0000 mg | ORAL_TABLET | Freq: Every day | ORAL | Status: DC
  Administered 2021-05-07 – 2021-05-11 (×5): 40 mg via ORAL
  Filled 2021-05-07 (×5): qty 2

## 2021-05-07 MED ORDER — AMLODIPINE BESYLATE 10 MG PO TABS
10.0000 mg | ORAL_TABLET | Freq: Every day | ORAL | Status: DC
  Administered 2021-05-08 – 2021-05-09 (×2): 10 mg via ORAL
  Filled 2021-05-07 (×2): qty 1

## 2021-05-07 MED ORDER — VANCOMYCIN HCL 1000 MG IV SOLR
INTRAVENOUS | Status: DC | PRN
  Administered 2021-05-07: 1000 mg

## 2021-05-07 MED ORDER — ROCURONIUM BROMIDE 10 MG/ML (PF) SYRINGE
PREFILLED_SYRINGE | INTRAVENOUS | Status: DC | PRN
  Administered 2021-05-07: 10 mg via INTRAVENOUS
  Administered 2021-05-07: 30 mg via INTRAVENOUS

## 2021-05-07 MED ORDER — DEXAMETHASONE SODIUM PHOSPHATE 10 MG/ML IJ SOLN
INTRAMUSCULAR | Status: AC
  Filled 2021-05-07: qty 1

## 2021-05-07 MED ORDER — SODIUM CHLORIDE 0.9 % IV SOLN: INTRAVENOUS | Status: DC | PRN

## 2021-05-07 MED ORDER — METHOCARBAMOL 500 MG PO TABS: 500.0000 mg | ORAL_TABLET | Freq: Four times a day (QID) | ORAL | 1 refills | Status: AC | PRN

## 2021-05-07 MED ORDER — FAMOTIDINE 20 MG PO TABS
10.0000 mg | ORAL_TABLET | Freq: Every day | ORAL | Status: DC
  Administered 2021-05-07: 10 mg via ORAL
  Administered 2021-05-08: 20 mg via ORAL
  Administered 2021-05-09 – 2021-05-11 (×3): 10 mg via ORAL
  Filled 2021-05-07 (×5): qty 1

## 2021-05-07 MED ORDER — ATORVASTATIN CALCIUM 40 MG PO TABS
40.0000 mg | ORAL_TABLET | Freq: Every day | ORAL | Status: DC
  Administered 2021-05-07 – 2021-05-11 (×5): 40 mg via ORAL
  Filled 2021-05-07 (×5): qty 1

## 2021-05-07 MED ORDER — LIDOCAINE HCL (PF) 2 % IJ SOLN
INTRAMUSCULAR | Status: AC
  Filled 2021-05-07: qty 5

## 2021-05-07 MED ORDER — CEFAZOLIN SODIUM-DEXTROSE 2-4 GM/100ML-% IV SOLN
2.0000 g | INTRAVENOUS | Status: AC
  Administered 2021-05-07: 2 g via INTRAVENOUS
  Filled 2021-05-07: qty 100

## 2021-05-07 MED ORDER — INSULIN ASPART 100 UNIT/ML IJ SOLN
0.0000 [IU] | Freq: Three times a day (TID) | INTRAMUSCULAR | Status: DC
  Administered 2021-05-07: 7 [IU] via SUBCUTANEOUS
  Administered 2021-05-08 (×2): 3 [IU] via SUBCUTANEOUS
  Administered 2021-05-08: 2 [IU] via SUBCUTANEOUS
  Administered 2021-05-09: 3 [IU] via SUBCUTANEOUS
  Administered 2021-05-10 (×2): 2 [IU] via SUBCUTANEOUS
  Administered 2021-05-11: 3 [IU] via SUBCUTANEOUS
  Administered 2021-05-11: 2 [IU] via SUBCUTANEOUS

## 2021-05-07 MED ORDER — IRBESARTAN 150 MG PO TABS
300.0000 mg | ORAL_TABLET | Freq: Every day | ORAL | Status: DC
  Filled 2021-05-07: qty 2

## 2021-05-07 MED ORDER — DEXTROSE 50 % IV SOLN
0.5000 | Freq: Once | INTRAVENOUS | Status: AC
  Administered 2021-05-07: 25 mL via INTRAVENOUS
  Filled 2021-05-07: qty 50

## 2021-05-07 MED ORDER — FENTANYL CITRATE PF 50 MCG/ML IJ SOSY
50.0000 ug | PREFILLED_SYRINGE | INTRAMUSCULAR | Status: DC
  Filled 2021-05-07: qty 2

## 2021-05-07 MED ORDER — DIPHENHYDRAMINE HCL 25 MG PO CAPS
25.0000 mg | ORAL_CAPSULE | Freq: Every day | ORAL | Status: DC
  Administered 2021-05-07 – 2021-05-11 (×5): 25 mg via ORAL
  Filled 2021-05-07 (×5): qty 1

## 2021-05-07 MED ORDER — DULOXETINE HCL 20 MG PO CPEP
40.0000 mg | ORAL_CAPSULE | Freq: Every day | ORAL | Status: DC
  Administered 2021-05-07 – 2021-05-10 (×4): 40 mg via ORAL
  Filled 2021-05-07 (×5): qty 2

## 2021-05-07 SURGICAL SUPPLY — 65 items
BAG COUNTER SPONGE SURGICOUNT (BAG) ×1 IMPLANT
BAG ZIPLOCK 12X15 (MISCELLANEOUS) ×2 IMPLANT
BLADE SAG 18X100X1.27 (BLADE) ×2 IMPLANT
BNDG COHESIVE 3X5 TAN ST LF (GAUZE/BANDAGES/DRESSINGS) ×1 IMPLANT
COVER BACK TABLE 60X90IN (DRAPES) ×2 IMPLANT
COVER SURGICAL LIGHT HANDLE (MISCELLANEOUS) ×2 IMPLANT
CUP SUT UNIV REVERS 36 NEUTRAL (Cup) ×1 IMPLANT
DRAPE ORTHO SPLIT 77X108 STRL (DRAPES) ×2
DRAPE SHEET LG 3/4 BI-LAMINATE (DRAPES) ×2 IMPLANT
DRAPE SURG 17X11 SM STRL (DRAPES) ×2 IMPLANT
DRAPE SURG ORHT 6 SPLT 77X108 (DRAPES) ×2 IMPLANT
DRAPE TOP 10253 STERILE (DRAPES) ×2 IMPLANT
DRAPE U-SHAPE 47X51 STRL (DRAPES) ×2 IMPLANT
DRSG AQUACEL AG ADV 3.5X 6 (GAUZE/BANDAGES/DRESSINGS) ×1 IMPLANT
DRSG AQUACEL AG ADV 3.5X10 (GAUZE/BANDAGES/DRESSINGS) ×2 IMPLANT
DURAPREP 26ML APPLICATOR (WOUND CARE) ×1 IMPLANT
ELECT REM PT RETURN 15FT ADLT (MISCELLANEOUS) ×2 IMPLANT
FACESHIELD WRAPAROUND (MASK) ×2 IMPLANT
FACESHIELD WRAPAROUND OR TEAM (MASK) ×1 IMPLANT
FIBERTAPE CERCLAGE TLINK SUT (SUTURE) ×1 IMPLANT
GLENOID UNI REV MOD 24 +2 LAT (Joint) ×1 IMPLANT
GLENOSPHERE 36 +4 LAT/24 (Joint) ×1 IMPLANT
GLOVE SRG 8 PF TXTR STRL LF DI (GLOVE) ×2 IMPLANT
GLOVE SURG ENC MOIS LTX SZ7.5 (GLOVE) ×8 IMPLANT
GLOVE SURG UNDER POLY LF SZ8 (GLOVE) ×2
GOWN STRL REUS W/ TWL XL LVL3 (GOWN DISPOSABLE) ×2 IMPLANT
GOWN STRL REUS W/TWL XL LVL3 (GOWN DISPOSABLE) ×2
KIT BASIN OR (CUSTOM PROCEDURE TRAY) ×2 IMPLANT
KIT TURNOVER KIT A (KITS) ×1 IMPLANT
LINER HUMERAL 36 +3MM SM (Shoulder) ×1 IMPLANT
MANIFOLD NEPTUNE II (INSTRUMENTS) ×2 IMPLANT
NDL TAPERED W/ NITINOL LOOP (MISCELLANEOUS) IMPLANT
NEEDLE TAPERED W/ NITINOL LOOP (MISCELLANEOUS) ×2 IMPLANT
NS IRRIG 1000ML POUR BTL (IV SOLUTION) ×2 IMPLANT
PACK SHOULDER (CUSTOM PROCEDURE TRAY) ×2 IMPLANT
PROTECTOR NERVE ULNAR (MISCELLANEOUS) ×2 IMPLANT
RESTRAINT HEAD UNIVERSAL NS (MISCELLANEOUS) ×1 IMPLANT
SCREW CENTRAL MODULAR 25 (Screw) ×1 IMPLANT
SCREW PERI LOCK 5.5X16 (Screw) ×2 IMPLANT
SCREW PERI LOCK 5.5X32 (Screw) ×1 IMPLANT
SCREW PERI LOCK 5.5X36 (Screw) ×1 IMPLANT
SLING ARM FOAM STRAP LRG (SOFTGOODS) ×1 IMPLANT
SLING ARM FOAM STRAP MED (SOFTGOODS) IMPLANT
SLING ARM IMMOBILIZER LRG (SOFTGOODS) ×1 IMPLANT
SMARTMIX MINI TOWER (MISCELLANEOUS)
SPONGE T-LAP 18X18 ~~LOC~~+RFID (SPONGE) ×2 IMPLANT
SPONGE T-LAP 4X18 ~~LOC~~+RFID (SPONGE) ×1 IMPLANT
STEM HUMERAL MOD SZ 5 135 DEG (Stem) ×1 IMPLANT
STRIP CLOSURE SKIN 1/2X4 (GAUZE/BANDAGES/DRESSINGS) ×2 IMPLANT
SUCTION FRAZIER HANDLE 10FR (MISCELLANEOUS) ×1
SUCTION TUBE FRAZIER 10FR DISP (MISCELLANEOUS) ×1 IMPLANT
SUT FIBERWIRE #2 38 T-5 BLUE (SUTURE)
SUT MON AB 3-0 SH 27 (SUTURE) ×1
SUT MON AB 3-0 SH27 (SUTURE) ×1 IMPLANT
SUT VIC AB 0 CT1 36 (SUTURE) ×2 IMPLANT
SUT VIC AB 1 CT1 36 (SUTURE) ×2 IMPLANT
SUT VIC AB 2-0 CT1 27 (SUTURE) ×1
SUT VIC AB 2-0 CT1 TAPERPNT 27 (SUTURE) ×1 IMPLANT
SUTURE FIBERWR #2 38 T-5 BLUE (SUTURE) IMPLANT
SUTURE TAPE 1.3 40 TPR END (SUTURE) ×2 IMPLANT
SUTURETAPE 1.3 40 TPR END (SUTURE) ×8
TOWEL OR 17X26 10 PK STRL BLUE (TOWEL DISPOSABLE) ×2 IMPLANT
TOWER SMARTMIX MINI (MISCELLANEOUS) IMPLANT
TUBE SUCTION HIGH CAP CLEAR NV (SUCTIONS) ×2 IMPLANT
arthrex fibertape cerclage tlink suture ×1 IMPLANT

## 2021-05-07 NOTE — Discharge Instructions (Signed)
Orthopedic surgery discharge instructions: ? ?-Maintain postoperative bandage until follow-up appointment.  This is waterproof, and you may begin showering on postoperative day #3.  Do not submerge underwater.  Maintain that bandage until your follow-up appointment in 2 weeks. ? ?-No lifting with operateive arm.  You may remove the arm to shower and do pendulums with the arm swinging at the side.  Otherwise, otherwise maintain your sling when you are out of the house and sleeping. ? ?-Apply ice liberally to the shoulder throughout the day.  For mild to moderate pain use Tylenol and Advil as needed around-the-clock.  For breakthrough pain use oxycodone as necessary. ? ?-You will return to see Dr. Aundria Rud in the office in 2 weeks for routine postoperative check with x-rays.  ?

## 2021-05-07 NOTE — Anesthesia Procedure Notes (Signed)
Procedure Name: Intubation ?Date/Time: 05/07/2021 8:45 AM ?Performed by: Renato Shin, CRNA ?Pre-anesthesia Checklist: Patient identified, Emergency Drugs available, Suction available and Patient being monitored ?Patient Re-evaluated:Patient Re-evaluated prior to induction ?Oxygen Delivery Method: Circle system utilized ?Preoxygenation: Pre-oxygenation with 100% oxygen ?Induction Type: IV induction ?Ventilation: Mask ventilation without difficulty ?Laryngoscope Size: Sabra Heck and 3 ?Grade View: Grade I ?Tube type: Oral ?Tube size: 7.0 mm ?Number of attempts: 1 ?Airway Equipment and Method: Stylet and Oral airway ?Placement Confirmation: ETT inserted through vocal cords under direct vision, positive ETCO2 and breath sounds checked- equal and bilateral ?Secured at: 21 cm ?Tube secured with: Tape ?Dental Injury: Teeth and Oropharynx as per pre-operative assessment  ? ? ? ? ?

## 2021-05-07 NOTE — Anesthesia Procedure Notes (Signed)
Anesthesia Regional Block: Interscalene brachial plexus block  ? ?Pre-Anesthetic Checklist: , timeout performed,  Correct Patient, Correct Site, Correct Laterality,  Correct Procedure, Correct Position, site marked,  Risks and benefits discussed,  Surgical consent,  Pre-op evaluation,  At surgeon's request and post-op pain management ? ?Laterality: Right ? ?Prep: chloraprep     ?  ?Needles:  ?Injection technique: Single-shot ? ?Needle Type: Echogenic Stimulator Needle   ? ? ?Needle Length: 9cm  ?Needle Gauge: 21  ? ? ? ?Additional Needles: ? ? ?Procedures:,,,, ultrasound used (permanent image in chart),,    ?Narrative:  ?Start time: 05/07/2021 8:05 AM ?End time: 05/07/2021 8:15 AM ?Injection made incrementally with aspirations every 5 mL. ? ?Performed by: Personally  ?Anesthesiologist: Leonides Grills, MD ? ?Additional Notes: ?Functioning IV was confirmed and monitors were applied.  A timeout was performed. Sterile prep, hand hygiene and sterile gloves were used. A 60mm 21ga Arrow echogenic stimulator needle was used. Negative aspiration and negative test dose prior to incremental administration of local anesthetic. The patient tolerated the procedure well. ? ?Ultrasound guidance: relevent anatomy identified, needle position confirmed, local anesthetic spread visualized around nerve(s), vascular puncture avoided.  Image printed for medical record.  ? ? ? ? ? ?

## 2021-05-07 NOTE — Anesthesia Preprocedure Evaluation (Addendum)
Anesthesia Evaluation  ?Patient identified by MRN, date of birth, ID band ?Patient awake ? ? ? ?Reviewed: ?Allergy & Precautions, NPO status , Patient's Chart, lab work & pertinent test results ? ?Airway ?Mallampati: II ? ?TM Distance: >3 FB ?Neck ROM: Full ? ? ? Dental ? ?(+) Edentulous Upper, Edentulous Lower ?  ?Pulmonary ?COPD,  COPD inhaler, former smoker,  ?  ?Pulmonary exam normal ?breath sounds clear to auscultation ? ? ? ? ? ? Cardiovascular ?hypertension, Pt. on medications and Pt. on home beta blockers ?Normal cardiovascular exam ?Rhythm:Regular Rate:Normal ? ? ?  ?Neuro/Psych ?PSYCHIATRIC DISORDERS Anxiety Depression  Neuromuscular disease   ? GI/Hepatic ?negative GI ROS, Neg liver ROS,   ?Endo/Other  ?diabetes, Insulin Dependent ? Renal/GU ?Renal disease  ? ?  ?Musculoskeletal ? ?(+) Arthritis ,  ? Abdominal ?(+) + obese,   ?Peds ? Hematology ?negative hematology ROS ?(+)   ?Anesthesia Other Findings ?Right proximal humerus non union ? Reproductive/Obstetrics ? ?  ? ? ? ? ? ? ? ? ? ? ? ? ? ?  ?  ? ? ? ? ? ? ? ?Anesthesia Physical ?Anesthesia Plan ? ?ASA: 3 ? ?Anesthesia Plan: General  ? ?Post-op Pain Management:   ? ?Induction: Intravenous ? ?PONV Risk Score and Plan: 3 and Ondansetron, Dexamethasone, Midazolam and Treatment may vary due to age or medical condition ? ?Airway Management Planned: Oral ETT ? ?Additional Equipment:  ? ?Intra-op Plan:  ? ?Post-operative Plan: Extubation in OR ? ?Informed Consent: I have reviewed the patients History and Physical, chart, labs and discussed the procedure including the risks, benefits and alternatives for the proposed anesthesia with the patient or authorized representative who has indicated his/her understanding and acceptance.  ? ? ? ? ? ?Plan Discussed with: CRNA ? ?Anesthesia Plan Comments:   ? ? ? ? ? ? ?Anesthesia Quick Evaluation ? ?

## 2021-05-07 NOTE — Progress Notes (Signed)
Assisted Dr. Ellender with right, interscalene , ultrasound guided block. Side rails up, monitors on throughout procedure. See vital signs in flow sheet. Tolerated Procedure well. ?

## 2021-05-07 NOTE — H&P (Signed)
? ?ORTHOPAEDIC H&P ? ?REQUESTING PHYSICIAN: Nicholes Stairs, MD ? ?PCP:  [facility] ? ?Chief Complaint: Right shoulder pain ? ?HPI: ?Alicia Shepard is a 66 y.o. female who complains of right shoulder pain and weakness following a fracture many months ago.  This resulted in a really nondisplaced surgical neck fracture.  However, has gone on to a nonunion.  She is here today for treatment definitively with reverse arthroplasty.  She has been very painful and pseudo paretic over the last couple of months.  No new complaints. ? ?Past Medical History:  ?Diagnosis Date  ? Anxiety   ? Arthritis   ? Chronic kidney disease   ? CKD stage 3  ? COPD (chronic obstructive pulmonary disease) ([place])   ? Depression   ? Diabetes mellitus without complication ([place])   ? Type 2  ? GERD (gastroesophageal reflux disease)   ? History of kidney stones   ? HOH (hard of hearing)   ? Hypertension   ? Neuromuscular disorder ([place])   ? neuropathy feet and hands  ? Pneumonia   ? ?Past Surgical History:  ?Procedure Laterality Date  ? BACK SURGERY    ? HERNIA REPAIR    ? x2  ? ?Social History  ? ?Socioeconomic History  ? Marital status: Widowed  ?  Spouse name: Not on file  ? Number of children: Not on file  ? Years of education: Not on file  ? Highest education level: Not on file  ?Occupational History  ? Not on file  ?Tobacco Use  ? Smoking status: Former  ?  Packs/day: 2.00  ?  Years: 50.00  ?  Pack years: 100.00  ?  Types: Cigarettes  ?  Quit date: 11/11/2020  ?  Years since quitting: 0.4  ? Smokeless tobacco: Never  ?Vaping Use  ? Vaping Use: Former  ?Substance and Sexual Activity  ? Alcohol use: Never  ? Drug use: Never  ? Sexual activity: Not Currently  ?Other Topics Concern  ? Not on file  ?Social History Narrative  ? Not on file  ? ?Social Determinants of Health  ? ?Financial Resource Strain: Not on file  ?Food Insecurity: Not on file  ?Transportation Needs: Not on file  ?Physical Activity: Not on file  ?Stress: Not on file  ?Social  Connections: Not on file  ? ?History reviewed. No pertinent family history. ?Allergies  ?Allergen Reactions  ? Codeine Nausea And Vomiting and Nausea Only  ?  Need to take nausea medication  ? Morphine Nausea Only  ?  Need nausea medication  ? ?Prior to Admission medications   ?Medication Sig Start Date End Date Taking? Authorizing Provider  ?albuterol (VENTOLIN HFA) 108 (90 Base) MCG/ACT inhaler Inhale 2 puffs into the lungs every 6 (six) hours as needed for shortness of breath. 04/11/21  Yes [provider]  ?amLODipine (NORVASC) 10 MG tablet Take 10 mg by mouth daily. 03/21/21  Yes [provider]  ?ARNUITY ELLIPTA 100 MCG/ACT AEPB Inhale 1 puff into the lungs daily. 04/19/21  Yes [provider]  ?atorvastatin (LIPITOR) 40 MG tablet Take 40 mg by mouth daily. 03/21/21  Yes [provider]  ?b complex vitamins capsule Take 1 capsule by mouth daily.   Yes [provider]  ?carvedilol (COREG) 25 MG tablet Take 25 mg by mouth 2 (two) times daily. 03/21/21  Yes [provider]  ?Cholecalciferol (VITAMIN D3) 125 MCG (5000 UT) CAPS Take by mouth.   Yes [provider]  ?  citalopram (CELEXA) 40 MG tablet Take 40 mg by mouth daily. 01/20/21  Yes [provider]  ?diphenhydrAMINE (BENADRYL) 25 mg capsule Take 25 mg by mouth daily.   Yes [provider]  ?DULoxetine (CYMBALTA) 20 MG capsule Take 40 mg by mouth at bedtime. 03/26/21  Yes [provider]  ?empagliflozin (JARDIANCE) 10 MG TABS tablet Take 10 mg by mouth daily.   Yes [provider]  ?famotidine (PEPCID) 20 MG tablet Take 10 mg by mouth in the morning.   Yes [provider]  ?gabapentin (NEURONTIN) 300 MG capsule Take 300 mg by mouth 3 (three) times daily. 03/19/21  Yes [provider]  ?insulin aspart (NOVOLOG) 100 UNIT/ML injection Inject 3-4 Units into the skin 3 (three) times daily before meals.   Yes [provider]  ?methocarbamol (ROBAXIN) 500  MG tablet Take 1,000 mg by mouth at bedtime. 04/11/21  Yes [provider]  ?olmesartan (BENICAR) 40 MG tablet Take 40 mg by mouth daily. 03/21/21  Yes [provider]  ?STIOLTO RESPIMAT 2.5-2.5 MCG/ACT AERS Inhale 2 puffs into the lungs in the morning. 04/19/21  Yes [provider]  ?torsemide (DEMADEX) 20 MG tablet Take 40 mg by mouth daily. 03/10/21  Yes [provider]  ?traMADol (ULTRAM) 50 MG tablet Take 100 mg by mouth every 6 (six) hours as needed for pain. 04/13/21  Yes [provider]  ?TRESIBA FLEXTOUCH 100 UNIT/ML FlexTouch Pen Inject 30 Units into the skin daily. 03/18/21  Yes [provider]  ?TRULICITY 1.5 0000000 SOPN Inject 1.5 mg into the skin once a week. 03/02/21  Yes [provider]  ? ?No results found. ? ?Positive ROS: All other systems have been reviewed and were otherwise negative with the exception of those mentioned in the HPI and as above. ? ?Physical Exam: ?General: Alert, no acute distress ?Cardiovascular: No pedal edema ?Respiratory: No cyanosis, no use of accessory musculature ?GI: No organomegaly, abdomen is soft and non-tender ?Skin: No lesions in the area of chief complaint ?Neurologic: Sensation intact distally ?Psychiatric: Patient is competent for consent with normal mood and affect ?Lymphatic: No axillary or cervical lymphadenopathy ? ?MUSCULOSKELETAL:  ?Right upper extremity is warm and well-perfused.  No open wounds or skin lesions.  She is neurovascular intact throughout but has pseudoparesis at the shoulder itself. ? ?Assessment: ?Right shoulder proximal humerus nonunion ? ?Plan: ?-Plan is to proceed today with reverse arthroplasty for definitive treatment.  We discussed the risk and benefits of the procedure in detail including but not limited to bleeding, infection, damage to surrounding nerves and vessels, stiffness, fracture, dislocation, need for further surgery, DVT, and the risk of anesthesia.  She has provided  informed consent. ? ?-Plan to discharge home postoperatively from PACU. ? ? ? ?Nicholes Stairs, MD ?Cell [phone]  ? ? ?05/07/2021 ?8:02 AM ? ?

## 2021-05-07 NOTE — Brief Op Note (Signed)
05/07/2021 ? ?11:01 AM ? ?PATIENT:  Alicia Shepard  66 y.o. female ? ?PRE-OPERATIVE DIAGNOSIS:  Right proximal humerus non union ? ?POST-OPERATIVE DIAGNOSIS:  Right proximal humerus non union ? ?PROCEDURE:  Procedure(s): ?REVERSE SHOULDER ARTHROPLASTY (Right) ? ?SURGEON:  Surgeon(s) and Role: ?   Stann Mainland, Elly Modena, MD - Primary ? ?PHYSICIAN ASSISTANT: Jonelle Sidle, PA-C ? ? ?ANESTHESIA:   regional and general ? ?EBL:  150 mL  ? ?BLOOD ADMINISTERED:none ? ?DRAINS: none  ? ?LOCAL MEDICATIONS USED:  NONE ? ?SPECIMEN:  No Specimen ? ?DISPOSITION OF SPECIMEN:  N/A ? ?COUNTS:  YES ? ?TOURNIQUET:  * No tourniquets in log * ? ?DICTATION: .Note written in EPIC ? ?PLAN OF CARE: Admit for overnight observation ? ?PATIENT DISPOSITION:  PACU - hemodynamically stable. ?  ?Delay start of Pharmacological VTE agent (>24hrs) due to surgical blood loss or risk of bleeding: not applicable ? ?

## 2021-05-07 NOTE — Op Note (Signed)
Date: 05/07/2021 ?  ?PRE-OPERATIVE DIAGNOSIS:  Right proximal humerus fracture, with nonunion ?  ?POST-OPERATIVE DIAGNOSIS:  Same ?  ?PROCEDURE:  1.  Right REVERSE SHOULDER ARTHROPLASTY ? ?  ?SURGEON:  Yolonda Kida, MD ?  ?ASSISTANT: Dion Saucier, PA-C ? ?Assistant attestation: ? ?PA Sharon Seller was present for the entire procedure.  He participated in all portions. ?  ?ANESTHESIA:   General with a block ?  ?ESTIMATED BLOOD LOSS: See anesthesia record ?  ?PREOPERATIVE INDICATIONS:  ?Ms. Alicia Shepard is a very pleasant right-hand-dominant 66 year old female who is here today for right shoulder surgery.  She sustained a fracture to the right proximal humerus many months ago.  This was essentially nondisplaced and had greater tuberosity involvement as well as surgical neck.  Unfortunately, this went on to a painful nonunion with significant dysfunction.  Thus we elected to proceed with reverse shoulder arthroplasty for the plan.  The risks benefits and alternatives were discussed with the patient preoperatively including but not limited to the risks of infection, bleeding, nerve injury, cardiopulmonary complications, the need for revision surgery, dislocation, brachial plexus palsy, incomplete relief of pain, among others, and the patient was willing to proceed. The patient provided informed consent. ?  ?OPERATIVE IMPLANTS:  ?Arthrex size 5 modular universe stem ?Arthrex cerclage tape for split fracture along the proximal humerus ?Size 24 mm +2 mm lateralized glenosphere baseplate with a 25 mm central screw and 4 peripheral locking screws. ?36 mm +4 mm lateralized glenosphere ?Standard humeral tray with a +3 mm polyethylene liner ?  ?OPERATIVE FINDINGS:   ?There was obvious evidence of chronic nonunion through this proximal humeral surgical neck fracture.  This propagated to the metaphysis on the lateral cortex and right at the level of the calcar medially.  The greater tuberosity had healed nicely and was completely  healed back into the anatomic humeral head.  The subscapularis was intact, supraspinatus, infraspinatus, and teres minor were all intact to the fracture fragment.  Unfortunately, due to the chronicity the subscapularis was quite rigid and not very mobile even after releases and mobilization.  There was no pre-existing osteoarthritis.  The long head of the biceps tendon was intact but was quite thin and friable at the level of the fracture.  There was abundant fibrous nonunion material throughout the entire 360 degrees of the fracture site at the surgical neck.  Lastly, there was a nondisplaced fracture in the proximal humerus that was treated with a cerclage suture to prevent propagation while impacting the stem. ?  ?OPERATIVE PROCEDURE: The patient was brought to the operating room and placed in the supine position. General anesthesia was administered. IV antibiotics were given. Time out was performed. The upper extremity was prepped and draped in usual sterile fashion. The patient was in a beachchair position. Deltopectoral approach was carried out. After dissection through skin and subcutaneous fat, the cephalic vein was identified with the deltopectoral interval.  This was mobilized and taken lateral. ?  ?The fracture was identified and working through the fracture in the biceps groove the lesser and greater tuberosities were freed up and tagged with suture tape sutures.  The humeral head was fragmented and removed from the wound.  We did have to use a salt to liberate the humeral head at the anatomic neck given that this was still essentially in its anatomic position in relation to the tuberosities and the rotator cuff.  We did preserve the subscapularis and the greater tuberosity for later repair. ?  ?Next, the long head  of the biceps tendon was tenotomized in situ. ?  ?I then performed circumferential releases of the humerus.  I then moved to sizing the humerus.  There was some calcar bone loss and this was  used to reference the height of the stem, as was the upper border of the pectoralis major tendon.  The canal was reamed and found to fit best with a 5 mm fracture stem. ?  ?We next turned to the glenoid.  Deep retractors were placed, and I resected the labrum as well as the residual long head of biceps, and then placed a guidepin into the center position on the glenoid, with slight inferior declination. I then reamed over the guidepin, and this created a small metaphyseal cancellus blush inferiorly, removing just the cartilage to the subchondral bone superiorly. The base plate was selected and secured into place, and then I secured it centrally with a nonlocking screw, and I had excellent purchase both inferiorly and superiorly. I placed a short locking screws on anterior aspect,  And posterior aspect. ?  ?I then turned my attention to the glenosphere. ?  ?The glenosphere was completely seated, and had engagement of the [facility] taper. I then turned my attention back to the humerus. ?   ?The 5 mm fracture stem was seated to the appropriate height to allow approximate 5.6 cm from the top of the Pectoralis major tendon to the top of the glenosphere.   ? ?At this point we did notice that the nondisplaced fracture in the proximal segment that was essentially in the sagittal plane and perpendicular to the glenoid was beginning to propagate a bit while impacting the stem.  Thus we placed a cerclage tape around the proximal humerus with the stem gently in place to serve as a backboard to tighten the cerclage.  We then fixed this nicely around the lateral medial cortex to compress the fracture.  We then completed the impact of the stem with no further propagation of the fracture. ? ?The stem was placed in 30 degrees of retroversion.   Once the stem was impacted into place we trialed poly liners.  Using the standard humeral metaglen,  The shoulder had excellent motion, and was stable.  The final poly was impacted and again  showed good motion and stability.  Next, I irrigated the wounds copiously.  ?  ?The greater tuberosity was brought back to the humeral stem via combination of sutures based to the humeral stem as well as sutures based through the rotator cuff myotendinous junction.  We also impacted bone graft deep to the rotator cuff and greater tuberosity fragment against the lateral cortex of the bone.  Likewise, the same technique was used for horizontal and vertical stability with the lesser tuberosity piece. ? ?I then irrigated the shoulder copiously once more, and then placed 1 g of vancomycin powder, repaired the deltopectoral interval with Vicryl followed by subcutaneous monocryl and then subcuticular monocryl with Steri-Strips and sterile gauze for the skin. The patient was awakened and returned back in stable and satisfactory condition. There no complications and he tolerated the procedure well.  All counts were correct. ? The patient awakened from general anesthesia with no complications and transferred to PACU in stable condition. ?  ?Postoperative Plan: Joli Koob will remain in his sling for approximately 6 weeks.  For the first 2 weeks will be essentially quiet with just pendulums and elbow range of motion.  We will allow her to begin using the arm at the second  week for light computer work and Scientist, clinical (histocompatibility and immunogenetics)knife and fork activities.  Then beginning at week 4 we will begin outpatient therapy working on active range of motion and then passive range of motion and active assist, to begin at week 6.  Due to her medical morbidities she will be admitted postoperatively for overnight observation.  Discharge home tomorrow in sling.  Follow-up with me in 2 weeks with x-rays. ?

## 2021-05-07 NOTE — Transfer of Care (Signed)
Immediate Anesthesia Transfer of Care Note ? ?Patient: Alicia Shepard ? ?Procedure(s) Performed: REVERSE SHOULDER ARTHROPLASTY (Right: Shoulder) ? ?Patient Location: PACU ? ?Anesthesia Type:GA combined with regional for post-op pain ? ?Level of Consciousness: drowsy and patient cooperative ? ?Airway & Oxygen Therapy: Patient Spontanous Breathing and Patient connected to face mask oxygen ? ?Post-op Assessment: Report given to RN and Post -op Vital signs reviewed and stable ? ?Post vital signs: Reviewed and stable ? ?Last Vitals:  ?Vitals Value Taken Time  ?BP 117/50 05/07/21 1110  ?Temp    ?Pulse 59 05/07/21 1112  ?Resp 15 05/07/21 1112  ?SpO2 100 % 05/07/21 1112  ?Vitals shown include unvalidated device data. ? ?Last Pain:  ?Vitals:  ? 05/07/21 0827  ?TempSrc:   ?PainSc: 0-No pain  ?   ? ?Patients Stated Pain Goal: 5 (05/07/21 0802) ? ?Complications: No notable events documented. ?

## 2021-05-07 NOTE — Anesthesia Postprocedure Evaluation (Signed)
Anesthesia Post Note ? ?Patient: Karl Staunton ? ?Procedure(s) Performed: REVERSE SHOULDER ARTHROPLASTY (Right: Shoulder) ? ?  ? ?Patient location during evaluation: PACU ?Anesthesia Type: General and Regional ?Level of consciousness: awake ?Pain management: pain level controlled ?Vital Signs Assessment: post-procedure vital signs reviewed and stable ?Respiratory status: spontaneous breathing, nonlabored ventilation, respiratory function stable and patient connected to nasal cannula oxygen ?Cardiovascular status: blood pressure returned to baseline and stable ?Postop Assessment: no apparent nausea or vomiting ?Anesthetic complications: no ? ? ?No notable events documented. ? ?Last Vitals:  ?Vitals:  ? 05/07/21 1245 05/07/21 1441  ?BP: (!) 115/51 (!) 113/46  ?Pulse:  62  ?Resp:  18  ?Temp:  36.7 ?C  ?SpO2:  97%  ?  ?Last Pain:  ?Vitals:  ? 05/07/21 1441  ?TempSrc: Oral  ?PainSc:   ? ? ?  ?  ?  ?  ?  ?  ? ?Adan Beal P Zaela Graley ? ? ? ? ?

## 2021-05-08 LAB — GLUCOSE, CAPILLARY
Glucose-Capillary: 182 mg/dL — ABNORMAL HIGH (ref 70–99)
Glucose-Capillary: 184 mg/dL — ABNORMAL HIGH (ref 70–99)
Glucose-Capillary: 146 mg/dL — ABNORMAL HIGH (ref 70–99)
Glucose-Capillary: 194 mg/dL — ABNORMAL HIGH (ref 70–99)

## 2021-05-08 LAB — BASIC METABOLIC PANEL
Anion gap: 5 (ref 5–15)
BUN: 50 mg/dL — ABNORMAL HIGH (ref 8–23)
CO2: 20 mmol/L — ABNORMAL LOW (ref 22–32)
Calcium: 8.3 mg/dL — ABNORMAL LOW (ref 8.9–10.3)
Chloride: 110 mmol/L (ref 98–111)
Creatinine, Ser: 1.89 mg/dL — ABNORMAL HIGH (ref 0.44–1.00)
GFR, Estimated: 29 mL/min — ABNORMAL LOW (ref >60)
Glucose, Bld: 249 mg/dL — ABNORMAL HIGH (ref 70–99)
Potassium: 5.5 mmol/L — ABNORMAL HIGH (ref 3.5–5.1)
Sodium: 135 mmol/L (ref 135–145)

## 2021-05-08 LAB — HEMOGLOBIN AND HEMATOCRIT, BLOOD
HCT: 29.6 % — ABNORMAL LOW (ref 36.0–46.0)
Hemoglobin: 9.2 g/dL — ABNORMAL LOW (ref 12.0–15.0)

## 2021-05-08 MED ORDER — SODIUM ZIRCONIUM CYCLOSILICATE 10 G PO PACK
10.0000 g | PACK | Freq: Once | ORAL | Status: AC
  Administered 2021-05-08: 10 g via ORAL
  Filled 2021-05-08: qty 1

## 2021-05-08 MED ORDER — HYDROMORPHONE HCL 1 MG/ML IJ SOLN
0.5000 mg | Freq: Once | INTRAMUSCULAR | Status: AC
  Administered 2021-05-08: 0.5 mg via INTRAVENOUS
  Filled 2021-05-08: qty 0.5

## 2021-05-08 NOTE — Progress Notes (Signed)
? ?  Subjective: ? ?Alicia Shepard is a 66 y.o. female, 1 Day Post-Op  ?  s/p Procedure(s): ?REVERSE SHOULDER ARTHROPLASTY ? ? ?Patient reports pain as moderate.  Patient reports significant throbbing pain of the right operative shoulder.  Denies numbness or tingling.  Denies fever or chills.  Reports that her block is worn off.  She has been able to urinate and has passed gas.  Denies fever or chills.  Denies calf pain. ? ?Objective:  ? ?VITALS:   ?Vitals:  ? 05/07/21 2114 05/08/21 0106 05/08/21 0502 05/08/21 0553  ?BP: (!) 119/43 (!) 118/50  (!) 123/42  ?Pulse: 77 72 76   ?Resp: 16 18 18    ?Temp: 98.1 ?F (36.7 ?C) 97.9 ?F (36.6 ?C) 98.1 ?F (36.7 ?C)   ?TempSrc: Oral Oral Oral   ?SpO2: 95% 97% 96% 96%  ?Weight:      ?Height:      ? ?Right Upper extremity:  ? ?Neurologically intact ?Neurovascular intact ?Sensation intact distally ?Intact pulses distally ?Dorsiflexion/Plantar flexion intact ?Incision: dressing C/D/I ?Compartment soft ?Calf soft bilaterally ?Decrease elbow motion secondary to shoulder pain, digits/wrist intact.  ? ?Lab Results  ?Component Value Date  ? WBC 16.4 (H) 05/07/2021  ? HGB 9.2 (L) 05/08/2021  ? HCT 29.6 (L) 05/08/2021  ? MCV 93.0 05/07/2021  ? PLT 207 05/07/2021  ? ?BMET ?   ?Component Value Date/Time  ? NA 135 05/08/2021 0334  ? K 5.5 (H) 05/08/2021 0334  ? CL 110 05/08/2021 0334  ? CO2 20 (L) 05/08/2021 0334  ? GLUCOSE 249 (H) 05/08/2021 0334  ? BUN 50 (H) 05/08/2021 0334  ? CREATININE 1.89 (H) 05/08/2021 0334  ? CALCIUM 8.3 (L) 05/08/2021 0334  ? GFRNONAA 29 (L) 05/08/2021 0334  ? ? ? ?Assessment/Plan: ?1 Day Post-Op  ? ?Principal Problem: ?  S/P reverse total shoulder arthroplasty, right ? ? ?Advance diet ?Up with therapy ?Patient does have elevated creatinine and potassium.  Creatinine elevation is similar to preoperative visit, creatinine is gone up to 1.89.  Pharmacy recommended discontinuing her Jardiance and starting Lokelma.  We will reevaluate her labs to monitor kidney function and  potassium.  Once these stabilized or normalized we will plan for discharge.  Likely will discharge tomorrow. ? ?Weightbearing Status: Nonweightbearing right upper extremity in a sling, okay for elbow, wrist, and digit range of motion. ?DVT Prophylaxis: SCDs, Lovenox ? ? ?05/10/2021 Annica Marinello ?05/08/2021, 8:04 AM ? ?05/10/2021 PA-C  ?Physician Assistant with Dr. Dion Saucier [place] ? ?

## 2021-05-08 NOTE — Plan of Care (Signed)
  Problem: Education: Goal: Knowledge of General Education information will improve Description: Including pain rating scale, medication(s)/side effects and non-pharmacologic comfort measures Outcome: Progressing   Problem: Activity: Goal: Risk for activity intolerance will decrease Outcome: Progressing   Problem: Pain Managment: Goal: General experience of comfort will improve Outcome: Progressing   

## 2021-05-08 NOTE — Evaluation (Signed)
Occupational Therapy Evaluation ?Patient Details ?Name: Alicia Shepard ?MRN: [id number] ?DOB: 06-20-1955 ?Today's Date: 05/08/2021 ? ? ?History of Present Illness Patient is a 66 year old woman s/p right reverse TSA  ? ?Clinical Impression ?  ?Mrs. Alicia Shepard is a 66 year old woman s/p shoulder replacement without functional use of right dominant upper extremity secondary to effects of surgery and interscalene block and shoulder precautions. Therapist provided education and instruction to patient and daughter in regards to exercises, precautions, positioning, and bathing while maintaining shoulder precautions, ice and edema management. Patient and daughter verbalized understanding and handouts provided to maximize retention. Dressing and exercises deferred today due to pain. Patient will benefit from skilled OT services while in hospital to improve deficits and learn compensatory strategies as needed in order to return to home at discharge.    ?  ?   ? ?Recommendations for follow up therapy are one component of a multi-disciplinary discharge planning process, led by the attending physician.  Recommendations may be updated based on patient status, additional functional criteria and insurance authorization.  ? ?Follow Up Recommendations ? Follow physician's recommendations for discharge plan and follow up therapies  ?  ?Assistance Recommended at Discharge Frequent or constant Supervision/Assistance  ?Patient can return home with the following A little help with walking and/or transfers;A lot of help with bathing/dressing/bathroom;Assistance with cooking/housework;Help with stairs or ramp for entrance ? ?  ?Functional Status Assessment ? Patient has had a recent decline in their functional status and demonstrates the ability to make significant improvements in function in a reasonable and predictable amount of time.  ?Equipment Recommendations ? None recommended by OT  ?  ?Recommendations for Other Services   ? ? ?   ?Precautions / Restrictions Precautions ?Precaution Comments: Per PA follow Op note instructions and allow elbow, hand,wrist movement as well as scapular retractions: Per Op note:  For the first 2 weeks will be essentially quiet with just pendulums and elbow range of motion.  We will allow her to begin using the arm at the second week for light computer work and knife and fork activities.  Then beginning at week 4 we will begin outpatient therapy working on active range of motion and then passive range of motion and active assist, to begin at week 6. ?Required Braces or Orthoses: Sling ?Restrictions ?Weight Bearing Restrictions: Yes ?RUE Weight Bearing: Non weight bearing  ? ?  ? ?Mobility Bed Mobility ?  ?  ?  ?  ?  ?  ?  ?  ?  ? ?Transfers ?  ?  ?  ?  ?  ?  ?  ?  ?  ?  ?  ? ?  ?Balance Overall balance assessment: Mild deficits observed, not formally tested, History of Falls ?  ?  ?  ?  ?  ?  ?  ?  ?  ?  ?  ?  ?  ?  ?  ?  ?  ?  ?   ? ?ADL either performed or assessed with clinical judgement  ? ?ADL Overall ADL's : Needs assistance/impaired ?Eating/Feeding: Set up;Sitting ?  ?Grooming: Set up;Sitting ?  ?Upper Body Bathing: Moderate assistance;Sitting ?  ?Lower Body Bathing: Maximal assistance;Sit to/from stand ?  ?Upper Body Dressing : Maximal assistance;Sitting ?  ?Lower Body Dressing: Maximal assistance;Sit to/from stand ?  ?Toilet Transfer: Min guard;Regular Toilet;Grab bars ?  ?Toileting- Clothing Manipulation and Hygiene: Minimal assistance;Sit to/from stand ?  ?Tub/ Shower Transfer: Minimal assistance;Shower seat ?  ?  Functional mobility during ADLs: Minimal assistance ?General ADL Comments: Hand hold assist to ambulate as patient has a history of impaired balance.  ? ? ? ?Vision Patient Visual Report: No change from baseline ?   ?   ?Perception   ?  ?Praxis   ?  ? ?Pertinent Vitals/Pain Pain Assessment ?Pain Assessment: 0-10 ?Pain Score: 6  ?Pain Location: R shoulder ?Pain Descriptors / Indicators:  Aching ?Pain Intervention(s): Limited activity within patient's tolerance  ? ? ? ?Hand Dominance Right ?  ?Extremity/Trunk Assessment Upper Extremity Assessment ?Upper Extremity Assessment: RUE deficits/detail ?RUE Deficits / Details: limited by effects of block and shoulder precautions, ?  ?  ?  ?  ?  ?Communication Communication ?Communication: No difficulties ?  ?Cognition Arousal/Alertness: Awake/alert ?Behavior During Therapy: [facility] for tasks assessed/performed ?Overall Cognitive Status: Within Functional Limits for tasks assessed ?  ?  ?  ?  ?  ?  ?  ?  ?  ?  ?  ?  ?  ?  ?  ?  ?  ?  ?  ?General Comments    ? ?  ?Exercises   ?  ?Shoulder Instructions Shoulder Instructions ?Method for sponge bathing under operated UE: Patient able to independently direct caregiver;Caregiver independent with task ?Correct positioning of sling/immobilizer: Patient able to independently direct caregiver;Caregiver independent with task ?Sling wearing schedule (on at all times/off for ADL's): Patient able to independently direct caregiver;Caregiver independent with task ?Proper positioning of operated UE when showering: Caregiver independent with task;Patient able to independently direct caregiver ?Dressing change: Patient able to independently direct caregiver;Caregiver independent with task ?Positioning of UE while sleeping: Patient able to independently direct caregiver;Caregiver independent with task  ? ? ?Home Living Family/patient expects to be discharged to:: Private residence ?Living Arrangements: Children ?Available Help at Discharge: Family ?Type of Home: House ?Home Access: Stairs to enter ?Entrance Stairs-Number of Steps: 1 ?  ?Home Layout: Two level ?Alternate Level Stairs-Number of Steps: 14 ?  ?Bathroom Shower/Tub: Walk-in shower ?  ?  ?  ?  ?Home Equipment: Shower seat - built [employer] fundraiser (2 wheels);Cane - single point;Rollator (4 wheels) ?  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  ? ?  ?Prior  Functioning/Environment Prior Level of Function : Independent/Modified Independent ?  ?  ?  ?  ?  ?  ?Mobility Comments: hasn't been using a device recently ?ADLs Comments: independent ?  ? ?  ?  ?OT Problem List: Decreased strength;Decreased range of motion;Impaired UE functional use;Pain;Obesity ?  ?   ?OT Treatment/Interventions: Self-care/ADL training;Therapeutic exercise;DME and/or AE instruction;Patient/family education;Therapeutic activities;Balance training  ?  ?OT Goals(Current goals can be found in the care plan section) Acute Rehab OT Goals ?Patient Stated Goal: to go home ?OT Goal Formulation: With patient ?Time For Goal Achievement: 05/22/21 ?Potential to Achieve Goals: Good  ?OT Frequency: Min 2X/week ?  ? ?Co-evaluation   ?  ?  ?  ?  ? ?  ?AM-PAC OT "6 Clicks" Daily Activity     ?Outcome Measure Help from another person eating meals?: A Little ?Help from another person taking care of personal grooming?: A Little ?Help from another person toileting, which includes using toliet, bedpan, or urinal?: A Little ?Help from another person bathing (including washing, rinsing, drying)?: A Lot ?Help from another person to put on and taking off regular upper body clothing?: A Lot ?Help from another person to put on and taking off regular lower body clothing?: A Lot ?6 Click Score: 15 ?  ?  End of Session Nurse Communication: Mobility status ? ?Activity Tolerance: Patient tolerated treatment well ?Patient left: in chair;with family/visitor present ? ?OT Visit Diagnosis: Muscle weakness (generalized) (M62.81)  ?              ?Time: [id number] ?OT Time Calculation (min): 40 min ?Charges:  OT General Charges ?$OT Visit: 1 Visit ?OT Evaluation ?$OT Eval Low Complexity: 1 Low ?OT Treatments ?$Self Care/Home Management : 23-37 mins ? ?Trashawn Oquendo, OTR/L ?Acute Care Rehab Services  ?Office [phone] ?Pager: [phone]  ? ?Alexine Pilant L Raye Wiens ?05/08/2021, 10:49 AM ?

## 2021-05-09 ENCOUNTER — Encounter (HOSPITAL_COMMUNITY): Payer: Self-pay | Admitting: Orthopedic Surgery

## 2021-05-09 ENCOUNTER — Inpatient Hospital Stay (HOSPITAL_COMMUNITY): Payer: Medicare HMO

## 2021-05-09 DIAGNOSIS — M1990 Unspecified osteoarthritis, unspecified site: Secondary | ICD-10-CM | POA: Diagnosis present

## 2021-05-09 DIAGNOSIS — S42201K Unspecified fracture of upper end of right humerus, subsequent encounter for fracture with nonunion: Secondary | ICD-10-CM | POA: Diagnosis present

## 2021-05-09 DIAGNOSIS — Z7951 Long term (current) use of inhaled steroids: Secondary | ICD-10-CM | POA: Diagnosis not present

## 2021-05-09 DIAGNOSIS — Z87891 Personal history of nicotine dependence: Secondary | ICD-10-CM | POA: Diagnosis not present

## 2021-05-09 DIAGNOSIS — N1832 Chronic kidney disease, stage 3b: Secondary | ICD-10-CM | POA: Diagnosis present

## 2021-05-09 DIAGNOSIS — I129 Hypertensive chronic kidney disease with stage 1 through stage 4 chronic kidney disease, or unspecified chronic kidney disease: Secondary | ICD-10-CM | POA: Diagnosis present

## 2021-05-09 DIAGNOSIS — J449 Chronic obstructive pulmonary disease, unspecified: Secondary | ICD-10-CM | POA: Diagnosis present

## 2021-05-09 DIAGNOSIS — Z794 Long term (current) use of insulin: Secondary | ICD-10-CM | POA: Diagnosis not present

## 2021-05-09 DIAGNOSIS — R443 Hallucinations, unspecified: Secondary | ICD-10-CM | POA: Diagnosis not present

## 2021-05-09 DIAGNOSIS — F32A Depression, unspecified: Secondary | ICD-10-CM | POA: Diagnosis present

## 2021-05-09 DIAGNOSIS — Z885 Allergy status to narcotic agent status: Secondary | ICD-10-CM | POA: Diagnosis not present

## 2021-05-09 DIAGNOSIS — N179 Acute kidney failure, unspecified: Secondary | ICD-10-CM | POA: Diagnosis present

## 2021-05-09 DIAGNOSIS — E1140 Type 2 diabetes mellitus with diabetic neuropathy, unspecified: Secondary | ICD-10-CM | POA: Diagnosis present

## 2021-05-09 DIAGNOSIS — F419 Anxiety disorder, unspecified: Secondary | ICD-10-CM | POA: Diagnosis present

## 2021-05-09 DIAGNOSIS — Z79899 Other long term (current) drug therapy: Secondary | ICD-10-CM | POA: Diagnosis not present

## 2021-05-09 DIAGNOSIS — E669 Obesity, unspecified: Secondary | ICD-10-CM | POA: Diagnosis present

## 2021-05-09 DIAGNOSIS — Z6831 Body mass index (BMI) 31.0-31.9, adult: Secondary | ICD-10-CM | POA: Diagnosis not present

## 2021-05-09 DIAGNOSIS — E1122 Type 2 diabetes mellitus with diabetic chronic kidney disease: Secondary | ICD-10-CM | POA: Diagnosis present

## 2021-05-09 DIAGNOSIS — K219 Gastro-esophageal reflux disease without esophagitis: Secondary | ICD-10-CM | POA: Diagnosis present

## 2021-05-09 LAB — BASIC METABOLIC PANEL
Anion gap: 7 (ref 5–15)
BUN: 59 mg/dL — ABNORMAL HIGH (ref 8–23)
CO2: 20 mmol/L — ABNORMAL LOW (ref 22–32)
Calcium: 8.5 mg/dL — ABNORMAL LOW (ref 8.9–10.3)
Chloride: 107 mmol/L (ref 98–111)
Creatinine, Ser: 2.43 mg/dL — ABNORMAL HIGH (ref 0.44–1.00)
GFR, Estimated: 22 mL/min — ABNORMAL LOW (ref >60)
Glucose, Bld: 203 mg/dL — ABNORMAL HIGH (ref 70–99)
Potassium: 4.8 mmol/L (ref 3.5–5.1)
Sodium: 134 mmol/L — ABNORMAL LOW (ref 135–145)

## 2021-05-09 LAB — GLUCOSE, CAPILLARY
Glucose-Capillary: 157 mg/dL — ABNORMAL HIGH (ref 70–99)
Glucose-Capillary: 112 mg/dL — ABNORMAL HIGH (ref 70–99)
Glucose-Capillary: 113 mg/dL — ABNORMAL HIGH (ref 70–99)
Glucose-Capillary: 141 mg/dL — ABNORMAL HIGH (ref 70–99)

## 2021-05-09 MED ORDER — CARVEDILOL 12.5 MG PO TABS
12.5000 mg | ORAL_TABLET | Freq: Two times a day (BID) | ORAL | Status: DC
  Administered 2021-05-09 – 2021-05-11 (×4): 12.5 mg via ORAL
  Filled 2021-05-09 (×4): qty 1

## 2021-05-09 MED ORDER — ENOXAPARIN SODIUM 30 MG/0.3ML IJ SOSY
30.0000 mg | PREFILLED_SYRINGE | INTRAMUSCULAR | Status: DC
  Administered 2021-05-10: 30 mg via SUBCUTANEOUS
  Filled 2021-05-09: qty 0.3

## 2021-05-09 MED ORDER — SODIUM CHLORIDE 0.9 % IV SOLN: INTRAVENOUS | Status: DC

## 2021-05-09 NOTE — Plan of Care (Signed)
  Problem: Coping: Goal: Level of anxiety will decrease Outcome: Progressing   Problem: Pain Managment: Goal: General experience of comfort will improve Outcome: Progressing   

## 2021-05-09 NOTE — Consult Note (Signed)
Renal Service ?Consult Note ?[facility] ? ?Alicia Shepard ?05/09/2021 ?[name], MD ?Requesting Physician: Dr. Stann Mainland ? ?Reason for Consult: Renal failure  ?HPI: The patient is a 66 y.o. year-old w/ hx of CKD, COPD, anxiety/ depression, DM2 on insulin, HOH, HTN who was admitted on 3/24 for elective R shoulder surgery. She wad a surgical neck fracture that went into a nonunion and the procedure to do was a reverse arthroplasty. Pre-op labs from 04/24/21 showed a creat of 1.6. Operation was done on 3/24. On 3/25 pt was doing well, but the creat bumped up to 1.89.  Wilder Glade was held and lokelma was given for high K+ of 5.5. Today creat is up to 2.43. Asked to see for renal failure.  ? ?BP's since surgery have been all above 110 SBP, except one BP in the 90's today. SpO2's are good 95- 97 % on RA. I/O are 5.2 L in and 500 cc out total that were measured at least.  Wt on 3/24 was 82.6kg, nothing since.  ?   ?Inpatient meds have been norvasc, inhalers, lipitor, coreg, celexa, colace, jardiance, lovenox, pepcid, gabapentin, insulins, irbesartan (never got a dose of it here it appears), robaxin, lokelma, po norco ?  ?IV meds - IV ancef, tranexamic acid, reglan, zofran ? ?Daughter provides the history. RN says she was voiding well yesterday. She is getting up to the BR only w/ assistance. She has a large bulge in her SP area where she has a hernia. She denies any current SOB, cough, orthopnea.  ? ?ROS - denies CP, no joint pain, no HA, no blurry vision, no rash, no diarrhea, no nausea/ vomiting, no dysuria, no difficulty voiding ? ? ?Past Medical History  ?Past Medical History:  ?Diagnosis Date  ? Anxiety   ? Arthritis   ? Chronic kidney disease   ? CKD stage 3  ? COPD (chronic obstructive pulmonary disease) ([place])   ? Depression   ? Diabetes mellitus without complication ([place])   ? Type 2  ? GERD (gastroesophageal reflux disease)   ? History of kidney stones   ? HOH (hard of hearing)   ? Hypertension   ?  Neuromuscular disorder ([place])   ? neuropathy feet and hands  ? Pneumonia   ? ?Past Surgical History  ?Past Surgical History:  ?Procedure Laterality Date  ? BACK SURGERY    ? HERNIA REPAIR    ? x2  ? ?Family History History reviewed. No pertinent family history. ?Social History  reports that she quit smoking about 5 months ago. Her smoking use included cigarettes. She has a 100.00 pack-year smoking history. She has never used smokeless tobacco. She reports that she does not drink alcohol and does not use drugs. ?Allergies  ?Allergies  ?Allergen Reactions  ? Codeine Nausea And Vomiting and Nausea Only  ?  Need to take nausea medication  ? Morphine Nausea Only  ?  Need nausea medication  ? ?Home medications ?Prior to Admission medications   ?Medication Sig Start Date End Date Taking? Authorizing Provider  ?albuterol (VENTOLIN HFA) 108 (90 Base) MCG/ACT inhaler Inhale 2 puffs into the lungs every 6 (six) hours as needed for shortness of breath. 04/11/21  Yes [provider]  ?amLODipine (NORVASC) 10 MG tablet Take 10 mg by mouth daily. 03/21/21  Yes [provider]  ?ARNUITY ELLIPTA 100 MCG/ACT AEPB Inhale 1 puff into the lungs daily. 04/19/21  Yes [provider]  ?atorvastatin (LIPITOR) 40 MG tablet Take 40 mg by  mouth daily. 03/21/21  Yes [provider]  ?b complex vitamins capsule Take 1 capsule by mouth daily.   Yes [provider]  ?carvedilol (COREG) 25 MG tablet Take 25 mg by mouth 2 (two) times daily. 03/21/21  Yes [provider]  ?Cholecalciferol (VITAMIN D3) 125 MCG (5000 UT) CAPS Take by mouth.   Yes [provider]  ?citalopram (CELEXA) 40 MG tablet Take 40 mg by mouth daily. 01/20/21  Yes [provider]  ?diphenhydrAMINE (BENADRYL) 25 mg capsule Take 25 mg by mouth daily.   Yes [provider]  ?DULoxetine (CYMBALTA) 20 MG capsule Take 40 mg by mouth at bedtime. 03/26/21  Yes [provider]  ?empagliflozin (JARDIANCE) 10 MG  TABS tablet Take 10 mg by mouth daily.   Yes [provider]  ?famotidine (PEPCID) 20 MG tablet Take 10 mg by mouth in the morning.   Yes [provider]  ?gabapentin (NEURONTIN) 300 MG capsule Take 300 mg by mouth 3 (three) times daily. 03/19/21  Yes [provider]  ?insulin aspart (NOVOLOG) 100 UNIT/ML injection Inject 3-4 Units into the skin 3 (three) times daily before meals.   Yes [provider]  ?methocarbamol (ROBAXIN) 500 MG tablet Take 1,000 mg by mouth at bedtime. 04/11/21  Yes [provider]  ?methocarbamol (ROBAXIN) 500 MG tablet Take 1 tablet (500 mg total) by mouth every 6 (six) hours as needed for muscle spasms. 05/07/21  Yes Nicholes Stairs, MD  ?olmesartan (BENICAR) 40 MG tablet Take 40 mg by mouth daily. 03/21/21  Yes [provider]  ?ondansetron (ZOFRAN) 4 MG tablet Take 1 tablet (4 mg total) by mouth every 8 (eight) hours as needed for nausea or vomiting. 05/07/21  Yes Nicholes Stairs, MD  ?oxyCODONE (ROXICODONE) 5 MG immediate release tablet Take 1 tablet (5 mg total) by mouth every 8 (eight) hours as needed. 05/07/21 05/07/22 Yes Nicholes Stairs, MD  ?March Rummage 2.5-2.5 MCG/ACT AERS Inhale 2 puffs into the lungs in the morning. 04/19/21  Yes [provider]  ?torsemide (DEMADEX) 20 MG tablet Take 40 mg by mouth daily. 03/10/21  Yes [provider]  ?traMADol (ULTRAM) 50 MG tablet Take 100 mg by mouth every 6 (six) hours as needed for pain. 04/13/21  Yes [provider]  ?TRESIBA FLEXTOUCH 100 UNIT/ML FlexTouch Pen Inject 30 Units into the skin daily. 03/18/21  Yes [provider]  ?TRULICITY 1.5 DP/8.2UM SOPN Inject 1.5 mg into the skin once a week. 03/02/21  Yes [provider]  ? ? ? ?Vitals:  ? 05/09/21 0811 05/09/21 1404 05/09/21 1918 05/09/21 1926  ?BP:  (!) 98/49    ?Pulse:  71    ?Resp:  18    ?Temp:  97.8 ?F (36.6 ?C)    ?TempSrc:  Oral    ?SpO2: 91% (!) 89% 95% 95%  ?Weight:       ?Height:      ? ?Exam ?Gen alert, no distress, pleasant, HOH, on RA ?No rash, cyanosis or gangrene ?Sclera anicteric, throat clear  ?Possible JVD up to 12 cm ?Chest clear bilat to bases, no rales/ wheezing ?RRR no RG ?Abd soft ntnd +bs, large protrusion in SP area which is "her hernia" per the dtr ?MS no joint effusions or deformity ?Ext 2+ tight pitting edema bilat calves, no hip or UE edema ?Neuro is alert, Ox 3 , nf ?   ? ? Home meds include - norvasc, arnuity ellipta, lipitor, coreg 25 bid,  celexa, cymbalta, empagliflozin, pepcid, neurontin, insulin aspart tid, benicar 40, stiolto respimat, demadex 40 qd, ultram prn, tresiba insulin, trulicity weekly, prns/ vits/ supps ? ?    Date   Creat  eGFR         ?    2021  1.18  49 ml/min     ?    04/23/21  1.60  eGFR 35 ml/min, pre-op   ?    05/08/21  1.89  Post op D#1 ?    05/09/21  2.43   D#2   ? ? ? ?Assessment/ Plan: ?AKI on CKD 3b - b/l creatinine 1.60 from pre-op lab on 3/10.  Pt admitted for elective shoulder surgery 2 days ago, and post op creat bumped up to 1.8 yesterday and 2.4 today. Pt was on jardiance and an ARB at home, both are on hold here. Has had 5 L + of w/ IVF's but UOP is low. Having incontinence w/ coughing per the daughter. AKI unclear cause, - r/o bladder retention, and/or soft BP's and/or component of home ARB/ jardiance. For now place foley, lower BP's to let BP come up and get CXR given LE edema to make sure she is not vol overloaded. Lower IVF"s to 65 cc/hr. Will follow. Labs in am.  ?SP R shoulder surgery - on 3/26 ?HTN - lowering BP meds, let BP come up ?IDDM ?COPD  ?HOH ?  ? ? ? ?Kelly Splinter  MD ?05/09/2021, 8:10 PM ?Recent Labs  ?Lab 05/07/21 ?1252 05/08/21 ?0334 05/09/21 ?0330  ?HGB 10.4* 9.2*  --   ?CALCIUM  --  8.3* 8.5*  ?CREATININE 1.54* 1.89* 2.43*  ?K  --  5.5* 4.8  ? ? ?

## 2021-05-09 NOTE — Progress Notes (Signed)
Subjective: ?2 Days Post-Op Procedure(s) (LRB): ?REVERSE SHOULDER ARTHROPLASTY (Right) ?Seen in rounds for Dr. Aundria Rud ?Patient reports pain as moderate unless touched or moved then pain increases to an 8/10 ?Denies N/T, fevers/chills, SOB/CP ? ? ?Objective: ?Vital signs in last 24 hours: ?Temp:  [97.8 ?F (36.6 ?C)-98.4 ?F (36.9 ?C)] 98.4 ?F (36.9 ?C) (03/26 0532) ?Pulse Rate:  [70-73] 73 (03/26 0532) ?Resp:  [14-16] 16 (03/26 0532) ?BP: (130-144)/(45-56) 144/56 (03/26 0532) ?SpO2:  [90 %-99 %] 91 % (03/26 0811) ? ?Intake/Output from previous day: ?03/25 0701 - 03/26 0700 ?In: 1644.2 [P.O.:1460; I.V.:184.2] ?Out: -  ?Intake/Output this shift: ?No intake/output data recorded. ? ?Recent Labs  ?  05/07/21 ?1252 05/08/21 ?0334  ?HGB 10.4* 9.2*  ? ?Recent Labs  ?  05/07/21 ?1252 05/08/21 ?0334  ?WBC 16.4*  --   ?RBC 3.58*  --   ?HCT 33.3* 29.6*  ?PLT 207  --   ? ?Recent Labs  ?  05/08/21 ?0334 05/09/21 ?0330  ?NA 135 134*  ?K 5.5* 4.8  ?CL 110 107  ?CO2 20* 20*  ?BUN 50* 59*  ?CREATININE 1.89* 2.43*  ?GLUCOSE 249* 203*  ?CALCIUM 8.3* 8.5*  ? ?No results for input(s): LABPT, INR in the last 72 hours. ? ?Neurologically intact ?Neurovascular intact ?Sensation intact distally ?Intact pulses distally ?Incision: dressing C/D/I ?No cellulitis present ?Compartment soft ? ? ?Assessment/Plan: ?2 Days Post-Op Procedure(s) (LRB): ?REVERSE SHOULDER ARTHROPLASTY (Right) ?Advance diet ?Work with OT ?Diabetes meds switched yesterday due to elevating BUN/Creatinine levels. Levels have increased even more since the switch. K improved but others increased. Nephrology consulted for input into levels to see if there is anything else that should be added.  ?We will hang fluids until Nephro sees patient ?NWB on RUE okay for gentle ROM of elbow, wrist, and fingers ?DVT ppx: SCDs Lovenox ? ? ?Cherie Dark, PA-C ?[facility] ?[phone] ?05/09/2021, 8:28 AM ? ?

## 2021-05-09 NOTE — Progress Notes (Signed)
Occupational Therapy Treatment ?Patient Details ?Name: Alicia Shepard ?MRN: [id number] ?DOB: Sep 12, 1955 ?Today's Date: 05/09/2021 ? ? ?History of present illness Patient is a 66 year old woman s/p right reverse TSA ?  ?OT comments ? Treatment focused on reiteration of precautions, positioning, and Adl compensatory strategies. Therapist assisted patient with partial upper body bath and practice donning UB clothing and sling. Patient reports a lot of pain with minimal activity and barely any movement - even with elbow ROM. Patient unable to tolerate pendulums at this time due to pain. However patient can not safely do pendulums due to impaired balance and difficulty bending over at baseline. Educated patient's daughter on how to perform with assist (family gently sways arm). Patient's daughter present and verbalizes understanding of all education.  ?Due to patient's pain and edema would recommend ice cuff and cooler to help with pain management. Will f/u with patient tomorrow to reiterate education and assist with performing exercises.  ? ?Recommendations for follow up therapy are one component of a multi-disciplinary discharge planning process, led by the attending physician.  Recommendations may be updated based on patient status, additional functional criteria and insurance authorization. ?   ?Follow Up Recommendations ? Follow physician's recommendations for discharge plan and follow up therapies  ?  ?Assistance Recommended at Discharge Frequent or constant Supervision/Assistance  ?Patient can return home with the following ? A little help with walking and/or transfers;A lot of help with bathing/dressing/bathroom;Assistance with cooking/housework;Help with stairs or ramp for entrance ?  ?Equipment Recommendations ? None recommended by OT  ?  ?Recommendations for Other Services   ? ?  ?Precautions / Restrictions Precautions ?Precaution Comments: Per PA follow Op note instructions and allow elbow, hand,wrist movement as  well as scapular retractions: Per Op note:  For the first 2 weeks will be essentially quiet with just pendulums and elbow range of motion.  We will allow her to begin using the arm at the second week for light computer work and knife and fork activities.  Then beginning at week 4 we will begin outpatient therapy working on active range of motion and then passive range of motion and active assist, to begin at week 6. ?Required Braces or Orthoses: Sling ?Restrictions ?Weight Bearing Restrictions: Yes ?RUE Weight Bearing: Non weight bearing  ? ? ?  ? ?Mobility Bed Mobility ?  ?  ?  ?  ?  ?  ?  ?  ?  ? ?Transfers ?  ?  ?  ?  ?  ?  ?  ?  ?  ?  ?  ?  ?Balance   ?  ?  ?  ?  ?  ?  ?  ?  ?  ?  ?  ?  ?  ?  ?  ?  ?  ?  ?   ? ?ADL either performed or assessed with clinical judgement  ? ?ADL Overall ADL's : Needs assistance/impaired ?  ?  ?  ?  ?  ?  ?  ?  ?  ?  ?  ?  ?  ?  ?  ?  ?  ?  ?  ?General ADL Comments: Reiterated all education. Max assist for partial UB bath. Practiced upper body dressing, donning sling and reiterated position. Patient limtied by pain and guarding. Pain with just seesawing wash cloth and donning shirt that minimally moved shoulder. ?  ? ?Extremity/Trunk Assessment   ?  ?  ?  ?  ?  ? ?Vision   ?  ?  ?  Perception   ?  ?Praxis   ?  ? ?Cognition Arousal/Alertness: Awake/alert ?Behavior During Therapy: [facility] for tasks assessed/performed ?Overall Cognitive Status: Within Functional Limits for tasks assessed ?  ?  ?  ?  ?  ?  ?  ?  ?  ?  ?  ?  ?  ?  ?  ?  ?  ?  ?  ?   ?Exercises Other Exercises ?Other Exercises: Went through elbow, hand and wrist exercises x 10. ?Other Exercises: Educated patient's daughter on how to perform compensatory pendulum exercise ? ?  ?Shoulder Instructions   ? ? ?  ?General Comments    ? ? ?Pertinent Vitals/ Pain       Pain Assessment ?Pain Assessment: Faces ?Pain Score: 8  ?Pain Location: R shoulder ?Pain Descriptors / Indicators: Aching, Guarding, Grimacing, Jabbing ?Pain  Intervention(s): Limited activity within patient's tolerance, Premedicated before session ? ?Home Living   ?  ?  ?  ?  ?  ?  ?  ?  ?  ?  ?  ?  ?  ?  ?  ?  ?  ?  ? ?  ?Prior Functioning/Environment    ?  ?  ?  ?   ? ?Frequency ? Min 2X/week  ? ? ? ? ?  ?Progress Toward Goals ? ?OT Goals(current goals can now be found in the care plan section) ? Progress towards OT goals: Progressing toward goals ? ?Acute Rehab OT Goals ?Patient Stated Goal: less pain ?OT Goal Formulation: With patient ?Time For Goal Achievement: 05/22/21 ?Potential to Achieve Goals: Good  ?Plan     ? ?Co-evaluation ? ? ?   ?  ?  ?  ?  ? ?  ?AM-PAC OT "6 Clicks" Daily Activity     ?Outcome Measure ? ? Help from another person eating meals?: A Little ?Help from another person taking care of personal grooming?: A Little ?Help from another person toileting, which includes using toliet, bedpan, or urinal?: A Little ?Help from another person bathing (including washing, rinsing, drying)?: A Lot ?Help from another person to put on and taking off regular upper body clothing?: A Lot ?Help from another person to put on and taking off regular lower body clothing?: A Lot ?6 Click Score: 15 ? ?  ?End of Session   ? ?OT Visit Diagnosis: Muscle weakness (generalized) (M62.81) ?  ?Activity Tolerance Patient limited by pain ?  ?Patient Left in chair;with family/visitor present ?  ?Nurse Communication Mobility status (request use of ice cooler and cuff) ?  ? ?   ? ?Time: 5093-2671 ?OT Time Calculation (min): 33 min ? ?Charges: OT General Charges ?$OT Visit: 1 Visit ?OT Treatments ?$Self Care/Home Management : 8-22 mins ?$Therapeutic Exercise: 8-22 mins ? ?Tallula Grindle, OTR/L ?Acute Care Rehab Services  ?Office [phone] ?Pager: [phone]  ? ?Kya Mayfield L Hawley Michel ?05/09/2021, 11:53 AM ?

## 2021-05-09 NOTE — Plan of Care (Signed)
  Problem: Education: Goal: Knowledge of General Education information will improve Description: Including pain rating scale, medication(s)/side effects and non-pharmacologic comfort measures Outcome: Progressing   Problem: Activity: Goal: Risk for activity intolerance will decrease Outcome: Progressing   Problem: Pain Managment: Goal: General experience of comfort will improve Outcome: Progressing   

## 2021-05-10 ENCOUNTER — Inpatient Hospital Stay (HOSPITAL_COMMUNITY): Payer: Medicare HMO

## 2021-05-10 LAB — BASIC METABOLIC PANEL
Anion gap: 6 (ref 5–15)
BUN: 55 mg/dL — ABNORMAL HIGH (ref 8–23)
CO2: 20 mmol/L — ABNORMAL LOW (ref 22–32)
Calcium: 8.4 mg/dL — ABNORMAL LOW (ref 8.9–10.3)
Chloride: 110 mmol/L (ref 98–111)
Creatinine, Ser: 2.01 mg/dL — ABNORMAL HIGH (ref 0.44–1.00)
GFR, Estimated: 27 mL/min — ABNORMAL LOW (ref >60)
Glucose, Bld: 131 mg/dL — ABNORMAL HIGH (ref 70–99)
Potassium: 4.8 mmol/L (ref 3.5–5.1)
Sodium: 136 mmol/L (ref 135–145)

## 2021-05-10 LAB — GLUCOSE, CAPILLARY
Glucose-Capillary: 126 mg/dL — ABNORMAL HIGH (ref 70–99)
Glucose-Capillary: 120 mg/dL — ABNORMAL HIGH (ref 70–99)
Glucose-Capillary: 148 mg/dL — ABNORMAL HIGH (ref 70–99)
Glucose-Capillary: 153 mg/dL — ABNORMAL HIGH (ref 70–99)

## 2021-05-10 MED ORDER — CHLORHEXIDINE GLUCONATE CLOTH 2 % EX PADS
6.0000 | MEDICATED_PAD | Freq: Every day | CUTANEOUS | Status: DC
  Administered 2021-05-10 – 2021-05-11 (×2): 6 via TOPICAL

## 2021-05-10 NOTE — Plan of Care (Signed)
  Problem: Activity: Goal: Risk for activity intolerance will decrease Outcome: Progressing   Problem: Pain Managment: Goal: General experience of comfort will improve Outcome: Progressing   Problem: Safety: Goal: Ability to remain free from injury will improve Outcome: Progressing   

## 2021-05-10 NOTE — Plan of Care (Signed)
?  Problem: Elimination: ?Goal: Will not experience complications related to bowel motility ?Outcome: Progressing ?Goal: Will not experience complications related to urinary retention ?Outcome: Progressing ?  ?Problem: Coping: ?Goal: Level of anxiety will decrease ?Outcome: Progressing ?  ?Problem: Pain Managment: ?Goal: General experience of comfort will improve ?Outcome: Progressing ?  ?

## 2021-05-10 NOTE — Progress Notes (Signed)
Occupational Therapy Treatment ?Patient Details ?Name: Alicia Shepard ?MRN: [id number] ?DOB: [birth year] ?Today's Date: 05/10/2021 ? ? ?History of present illness Patient is a 66 year old woman s/p right reverse TSA ?  ?OT comments ? Patient and daughter were educated on UB dressing/bathing tasks with patient's daughter able to demonstrate see saw method for washing.patients daughter was able to teach back some of education provided from previous OT sessions.  Patient will need 24/7 caregiver support in next level of care to be successful. Patients daughter reported that patient will have 24/7 caregiver support in next level of care. Patient would continue to benefit from skilled OT services at this time while admitted and after d/c to address noted deficits in order to improve overall safety and independence in ADLs.  ?  ? ?Recommendations for follow up therapy are one component of a multi-disciplinary discharge planning process, led by the attending physician.  Recommendations may be updated based on patient status, additional functional criteria and insurance authorization. ?   ?Follow Up Recommendations ? Follow physician's recommendations for discharge plan and follow up therapies  ?  ?Assistance Recommended at Discharge Frequent or constant Supervision/Assistance  ?Patient can return home with the following ? A little help with walking and/or transfers;A lot of help with bathing/dressing/bathroom;Assistance with cooking/housework;Help with stairs or ramp for entrance ?  ?Equipment Recommendations ? None recommended by OT  ?  ?Recommendations for Other Services   ? ?  ?Precautions / Restrictions Precautions ?Precaution Comments: Per PA follow Op note instructions and allow elbow, hand,wrist movement as well as scapular retractions: Per Op note:  For the first 2 weeks will be essentially quiet with just pendulums and elbow range of motion.  We will allow her to begin using the arm at the second week for light computer  work and knife and fork activities.  Then beginning at week 4 we will begin outpatient therapy working on active range of motion and then passive range of motion and active assist, to begin at week 6. ?Required Braces or Orthoses: Sling ?Restrictions ?Weight Bearing Restrictions: Yes ?RUE Weight Bearing: Non weight bearing  ? ? ?  ? ?Mobility Bed Mobility ?Overal bed mobility: Needs Assistance ?Bed Mobility: Supine to Sit ?  ?  ?Supine to sit: Max assist ?  ?  ?General bed mobility comments: patient does not preform bed mobility at home sleeps in recliner. patient was noted to have poor trunk control sitting on edge of bed with mutliple instances of posterior leaning noted. patient required min guard to maintain sitting balance. ?  ? ?Transfers ?  ?  ?  ?  ?  ?  ?  ?  ?  ?  ?  ?  ?Balance   ?  ?  ?  ?  ?  ?  ?  ?  ?  ?  ?  ?  ?  ?  ?  ?  ?  ?  ?   ? ?ADL either performed or assessed with clinical judgement  ? ?ADL Overall ADL's : Needs assistance/impaired ?  ?  ?Grooming: Set up;Sitting;Wash/dry face ?Grooming Details (indicate cue type and reason): in recliner ?Upper Body Bathing: Moderate assistance;Sitting ?Upper Body Bathing Details (indicate cue type and reason): with daughter able to demonstrate UB bathing/drying tasks with MI.with current ROM restrictions ?Lower Body Bathing: Maximal assistance;Sit to/from stand ?Lower Body Bathing Details (indicate cue type and reason): patients daughter able to assist with these tasks in next level of care. ?Upper Body Dressing :  Maximal assistance;Sitting ?Upper Body Dressing Details (indicate cue type and reason): with education provided on donning sling appropirately. patients daughter verbalized understanding. ?  ?  ?  ?Toilet Transfer Details (indicate cue type and reason): patient was min guard for transfer from edge of bed to recliner in room with increased time. patient was noted to need increased multimodal cues for turning. ?  ?  ?  ?  ?  ?General ADL Comments:  daughter reported that patient has over 10 steps to get up to her bedroom at home. patient and daughter were educated on how temporary downstairs set up mibght be benifital for patient at this time. patient's daughter verbalized understanding and reported they could make something up for her on 1st floor if needed. ?  ? ?Extremity/Trunk Assessment Upper Extremity Assessment ?RUE Deficits / Details: R total shouler restrictions in place at this time. ?  ?  ?  ?  ?  ? ?Vision   ?  ?  ?Perception   ?  ?Praxis   ?  ? ?Cognition Arousal/Alertness: Awake/alert ?[facility] for tasks assessed/performed ?Overall Cognitive Status: Within Functional Limits for tasks assessed ?  ?  ?  ?  ?  ?  ?  ?  ?  ?  ?  ?  ?  ?  ?  ?  ?  ?  ?  ?   ?Exercises   ? ?  ?Shoulder Instructions Shoulder Instructions ?Donning/doffing shirt without moving shoulder: Caregiver independent with task ?Method for sponge bathing under operated UE: Caregiver independent with task ?Correct positioning of sling/immobilizer: Caregiver independent with task ?Sling wearing schedule (on at all times/off for ADL's): Caregiver independent with task ?Proper positioning of operated UE when showering: Caregiver independent with task ?Positioning of UE while sleeping: Caregiver independent with task ? ? ?  ?General Comments    ? ? ?Pertinent Vitals/ Pain         ? ?Home Living   ?  ?  ?  ?  ?  ?  ?  ?  ?  ?  ?  ?  ?  ?  ?  ?  ?  ?  ? ?  ?Prior Functioning/Environment    ?  ?  ?  ?   ? ?Frequency ? Min 2X/week  ? ? ? ? ?  ?Progress Toward Goals ? ?OT Goals(current goals can now be found in the care plan section) ? Progress towards OT goals: Progressing toward goals ? ?   ?Plan Discharge plan remains appropriate   ? ?Co-evaluation ? ? ?   ?  ?  ?  ?  ? ?  ?AM-PAC OT "6 Clicks" Daily Activity     ?Outcome Measure ? ? Help from another person eating meals?: A Little ?Help from another person taking care of personal grooming?: A Little ?Help from another  person toileting, which includes using toliet, bedpan, or urinal?: A Little ?Help from another person bathing (including washing, rinsing, drying)?: A Lot ?Help from another person to put on and taking off regular upper body clothing?: A Lot ?Help from another person to put on and taking off regular lower body clothing?: A Lot ?6 Click Score: 15 ? ?  ?End of Session   ? ?OT Visit Diagnosis: Muscle weakness (generalized) (M62.81) ?  ?Activity Tolerance Patient tolerated treatment well ?  ?Patient Left in chair;with family/visitor present;with call bell/phone within reach ?  ?Nurse Communication Other (comment) (OK to participate in session.) ?  ? ?   ? ?  Time: 1127-1200 ?OT Time Calculation (min): 33 min ? ?Charges: OT General Charges ?$OT Visit: 1 Visit ?OT Treatments ?$Self Care/Home Management : 23-37 mins ? ?Tanesha Arambula OTR/L, MS ?Acute Rehabilitation Department ?Office# [phone] ?Pager# [phone] ? ? ? ?05/10/2021, 1:50 PM ?

## 2021-05-10 NOTE — Progress Notes (Signed)
Subjective: ?3 Days Post-Op Procedure(s) (LRB): ?REVERSE SHOULDER ARTHROPLASTY (Right) ? ?Patient reports pain as moderate .  Doing better than over the last few days. ? ? ?Objective: ?Vital signs in last 24 hours: ?Temp:  [98.7 ?F (37.1 ?C)-101.3 ?F (38.5 ?C)] 98.7 ?F (37.1 ?C) (03/27 1318) ?Pulse Rate:  [72-81] 72 (03/27 1318) ?Resp:  [12-20] 20 (03/27 1318) ?BP: (107-127)/(45-49) 127/47 (03/27 1318) ?SpO2:  [88 %-95 %] 93 % (03/27 1318) ? ?Intake/Output from previous day: ?03/26 0701 - 03/27 0700 ?In: 1484.4 [P.O.:480; I.V.:1004.4] ?Out: 1625 [Urine:1625] ?Intake/Output this shift: ?Total I/O ?In: 760 [P.O.:760] ?Out: 650 [Urine:650] ? ?Recent Labs  ?  05/08/21 ?0334  ?HGB 9.2*  ? ?Recent Labs  ?  05/08/21 ?0334  ?HCT 29.6*  ? ?Recent Labs  ?  05/09/21 ?0330 05/10/21 ?0703  ?NA 134* 136  ?K 4.8 4.8  ?CL 107 110  ?CO2 20* 20*  ?BUN 59* 55*  ?CREATININE 2.43* 2.01*  ?GLUCOSE 203* 131*  ?CALCIUM 8.5* 8.4*  ? ?No results for input(s): LABPT, INR in the last 72 hours. ? ?Neurologically intact ?Neurovascular intact ?Sensation intact distally ?Intact pulses distally ?Incision: dressing C/D/I ?No cellulitis present ?Compartment soft ? ? ?Assessment/Plan: ?3 Days Post-Op Procedure(s) (LRB): ?REVERSE SHOULDER ARTHROPLASTY (Right) ?Advance diet ?Work with OT ?NWB on RUE okay for gentle ROM of elbow, wrist, and fingers.  Also okay to remove the sling for elbow range of motion. ? ?Acute kidney injury seems to be improving.  We appreciate nephrology input.  Will maintain Foley catheter at least overnight.  We will plan on discontinuing that in the morning around shift change.  Will monitor for voiding trial.  We will also continue to follow-up on daily BM P. ? ?DVT ppx: SCDs Lovenox ? ? ?Yolonda Kida, MD ?[facility] ?[phone] ?05/10/2021, 3:32 PM ? ?

## 2021-05-10 NOTE — Progress Notes (Signed)
Admit: 05/07/2021 ?LOS: 1 ? ?52F AKI on CKD3 in setting of 3/25 R elective shoulder surgery.   ? ?Subjective:  ?SCr downtrending ?Good UOP, apparently sig output after foley placed ?Daughter at bedside, updated ?Tolerating PO well ?Apparently she often 'coached' her self to make urine, struggling to get started. ? ?03/26 0701 - 03/27 0700 ?In: 1484.4 [P.O.:480; I.V.:1004.4] ?Out: 1625 [Urine:1625] ? ?Filed Weights  ? 05/07/21 0726  ?Weight: 82.6 kg  ? ? ?Scheduled Meds: ? arformoterol  15 mcg Nebulization BID  ? And  ? umeclidinium bromide  1 puff Inhalation Daily  ? atorvastatin  40 mg Oral Daily  ? budesonide  0.25 mg Nebulization BID  ? carvedilol  12.5 mg Oral BID  ? Chlorhexidine Gluconate Cloth  6 each Topical Daily  ? citalopram  40 mg Oral Daily  ? diphenhydrAMINE  25 mg Oral Daily  ? docusate sodium  100 mg Oral BID  ? DULoxetine  40 mg Oral QHS  ? enoxaparin (LOVENOX) injection  30 mg Subcutaneous Q24H  ? famotidine  10 mg Oral Daily  ? gabapentin  300 mg Oral TID  ? insulin aspart  0-15 Units Subcutaneous TID WC  ? insulin aspart  0-5 Units Subcutaneous QHS  ? methocarbamol  1,000 mg Oral QHS  ? ?Continuous Infusions: ? sodium chloride 10 mL/hr at 05/07/21 1605  ? ?PRN Meds:.sodium chloride, acetaminophen, albuterol, HYDROcodone-acetaminophen, HYDROcodone-acetaminophen, menthol-cetylpyridinium **OR** phenol, metoCLOPramide **OR** metoCLOPramide (REGLAN) injection, morphine injection, ondansetron **OR** ondansetron (ZOFRAN) IV ? ?Current Labs: reviewed  ? ?Physical Exam:  Blood pressure (!) 127/47, pulse 72, temperature 98.7 ?F (37.1 ?C), resp. rate 20, height 5' 3.75" (1.619 m), weight 82.6 kg, SpO2 93 %. ?NAD in chair, R arm in sling ?RRR ?CTAB ant ?S/nt/nd ?No sig LEE ?Foley cath in place ?Nonfocal, CN2-12 intact ? ?A ?AKI on CKD3 ?BL SCr aroudn 1.6, sees K Lee with [facility] ?Peak SCr 2.4 3/26, improved today ?Probably combination ARB/SGLT2 and ? BOO (pain meds, anesthesia?) ?Good UOP,  nonoliguric with Foley ?? BOO, acute vs chronic:  ?Renal US today ?Will need TOV prior to DC ?S/p R shoulder surgery 3/24 per Ortho ? ?P ?Stop IVFs ?PO hydration ?Medication Issues; ?Preferred narcotic agents for pain control are hydromorphone, fentanyl, and methadone. Morphine should not be used.  ?Baclofen should be avoided ?Avoid oral sodium phosphate and magnesium citrate based laxatives / bowel preps  ? ? ?Sabra Heck MD ?05/10/2021, 3:42 PM ? ?Recent Labs  ?Lab 05/08/21 ?0334 05/09/21 ?0330 05/10/21 ?0703  ?NA 135 134* 136  ?K 5.5* 4.8 4.8  ?CL 110 107 110  ?CO2 20* 20* 20*  ?GLUCOSE 249* 203* 131*  ?BUN 50* 59* 55*  ?CREATININE 1.89* 2.43* 2.01*  ?CALCIUM 8.3* 8.5* 8.4*  ? ?Recent Labs  ?Lab 05/07/21 ?1252 05/08/21 ?0334  ?WBC 16.4*  --   ?HGB 10.4* 9.2*  ?HCT 33.3* 29.6*  ?MCV 93.0  --   ?PLT 207  --   ? ? ? ? ? ? ? ? ? ?  ?

## 2021-05-11 ENCOUNTER — Encounter (HOSPITAL_COMMUNITY): Payer: Self-pay | Admitting: Orthopedic Surgery

## 2021-05-11 LAB — BASIC METABOLIC PANEL
Anion gap: 6 (ref 5–15)
BUN: 48 mg/dL — ABNORMAL HIGH (ref 8–23)
CO2: 20 mmol/L — ABNORMAL LOW (ref 22–32)
Calcium: 8.4 mg/dL — ABNORMAL LOW (ref 8.9–10.3)
Chloride: 110 mmol/L (ref 98–111)
Creatinine, Ser: 1.61 mg/dL — ABNORMAL HIGH (ref 0.44–1.00)
GFR, Estimated: 35 mL/min — ABNORMAL LOW (ref >60)
Glucose, Bld: 143 mg/dL — ABNORMAL HIGH (ref 70–99)
Potassium: 4.8 mmol/L (ref 3.5–5.1)
Sodium: 136 mmol/L (ref 135–145)

## 2021-05-11 LAB — GLUCOSE, CAPILLARY
Glucose-Capillary: 127 mg/dL — ABNORMAL HIGH (ref 70–99)
Glucose-Capillary: 166 mg/dL — ABNORMAL HIGH (ref 70–99)

## 2021-05-11 MED ORDER — ENOXAPARIN SODIUM 40 MG/0.4ML IJ SOSY
40.0000 mg | PREFILLED_SYRINGE | INTRAMUSCULAR | Status: DC
  Administered 2021-05-11: 40 mg via SUBCUTANEOUS
  Filled 2021-05-11: qty 0.4

## 2021-05-11 NOTE — Plan of Care (Signed)
?  Problem: Activity: ?Goal: Risk for activity intolerance will decrease ?Outcome: Progressing ?  ?Problem: Safety: ?Goal: Ability to remain free from injury will improve ?Outcome: Progressing ?  ?Problem: Pain Managment: ?Goal: General experience of comfort will improve ?Outcome: Progressing ?  ?

## 2021-05-11 NOTE — Progress Notes (Signed)
? ?Subjective: ? ?Alicia Shepard is a 66 y.o. female, 4 Days Post-Op  ? s/p Procedure(s): ?REVERSE SHOULDER ARTHROPLASTY ? ? ?Patient reports pain as mild to moderate.  Patient is accompanied by her daughter today, reports patient's been hallucinating picking at the parrot that she has at home is here.  Patient reportedly thinks that she is at home.  She denies numbness or tingling.  Denies fever or chills.  She has been able to void approximately 200 cc since her Foley was removed.  Her daughter feels that she certainly had improvement in how she is acting returning to a more normal state minus the hallucinations. ? ?Objective:  ? ?VITALS:   ?Vitals:  ? 05/10/21 1811 05/10/21 2116 05/11/21 0503 05/11/21 1448  ?BP:  118/78 (!) 146/57   ?Pulse:  74 79   ?Resp:  18 18   ?Temp:  98.4 ?F (36.9 ?C) 97.7 ?F (36.5 ?C)   ?TempSrc:  Oral Oral   ?SpO2: 94% 95% 95% 93%  ?Weight:      ?Height:      ? ?No acute distress, sitting in chair ? ? ?Right Upper Extremity:  ? ?INSPECTION & PALPATION: ?Clean dry and intact Aquacel to the right shoulder ?  ?SENSORY: sensation is intact to light touch in:  ?superficial radial nerve distribution (dorsal first web space) ?median nerve distribution (tip of index finger)   ?ulnar nerve distribution (tip of small finger)    ?Axillary nerve distribution (lateral shoulder) ?  ?Intact elbow, wrist, and digit range of motion, decreased elbow motion secondary to stiffness ?  ?MOTOR:  ?+ motor posterior interosseous nerve (thumb IP extension) ?+ anterior interosseous nerve (thumb IP flexion, index finger DIP flexion) ?+ radial nerve (wrist extension) ?+ median nerve (palpable firing thenar mass) ?+ ulnar nerve (palpable firing of first dorsal interosseous muscle) ?+ Axillary nerve (palpable firing of deltoid) ?  ?VASCULAR: ?2+ radial pulse, brisk capillary refill < 2 sec, fingers warm and well-perfused ?  ?Calves bilaterally soft to palpation. ? ? ? ?Lab Results  ?Component Value Date  ? WBC 16.4 (H)  05/07/2021  ? HGB 9.2 (L) 05/08/2021  ? HCT 29.6 (L) 05/08/2021  ? MCV 93.0 05/07/2021  ? PLT 207 05/07/2021  ? ?BMET ?   ?Component Value Date/Time  ? NA 136 05/11/2021 0309  ? K 4.8 05/11/2021 0309  ? CL 110 05/11/2021 0309  ? CO2 20 (L) 05/11/2021 0309  ? GLUCOSE 143 (H) 05/11/2021 0309  ? BUN 48 (H) 05/11/2021 0309  ? CREATININE 1.61 (H) 05/11/2021 0309  ? CALCIUM 8.4 (L) 05/11/2021 0309  ? GFRNONAA 35 (L) 05/11/2021 0309  ? ? ? ?Assessment/Plan: ?4 Days Post-Op  ? ?Principal Problem: ?  S/P reverse total shoulder arthroplasty, right ? ? ?Advance diet ?Up with therapy ? ? ?Patient has had improvement in her creatinine and potassium.  Creatinine has returned to preoperative levels.  Foley was removed last night and patient has been able to void successfully.  We will plan to do a post residual void bladder scan after patient's next episode of urination, this was discussed with the nurse and the charge nurse.  If this is less than 100 cc and she will be okay to be discharged home. ? ?Discussed with patient's daughter avoidance of opioid pain medication upon discharge to help with constipation risks as well as hallucinations.  We discussed using methocarbamol, Tylenol, tramadol and oxycodone if needed. ? ?Renal ultrasound is normal per report.  Appreciate nephrology's input. ? ?Weightbearing  Status: Nonweightbearing right upper extremity, okay for elbow, wrist, and digit range of motion. ?DVT Prophylaxis: Lovenox ? ? ?Oretha Caprice Elona Yinger ?05/11/2021, 12:21 PM ? ?Dion Saucier PA-C  ?Physician Assistant with Dr. Rebekah [facility] [place] ? ?

## 2021-05-11 NOTE — Discharge Summary (Signed)
Patient ID: ?Alicia Shepard ?MRN: [id number] ?DOB/AGE: 66/30/1957 66 y.o. ? ?Admit date: 05/07/2021 ?Discharge date: 05/11/2021 ? ?Primary Diagnosis: Right proximal humerus nonunion ?Admission Diagnoses: Status post right reverse total shoulder arthroplasty ?Past Medical History:  ?Diagnosis Date  ? Anxiety   ? Arthritis   ? Chronic kidney disease   ? CKD stage 3  ? COPD (chronic obstructive pulmonary disease) ([place])   ? Depression   ? Diabetes mellitus without complication ([place])   ? Type 2  ? GERD (gastroesophageal reflux disease)   ? History of kidney stones   ? HOH (hard of hearing)   ? Hypertension   ? Neuromuscular disorder ([place])   ? neuropathy feet and hands  ? Pneumonia   ? ?Discharge Diagnoses:   ?Principal Problem: ?  S/P reverse total shoulder arthroplasty, right ? ?Estimated body mass index is 31.49 kg/m? as calculated from the following: ?  Height as of this encounter: 5' 3.75" (1.619 m). ?  Weight as of this encounter: 82.6 kg. ? ?Procedure:  ?Procedure(s) (LRB): ?REVERSE SHOULDER ARTHROPLASTY (Right)  ? ?Consults: nephrology ? ?HPI: Alicia Shepard is a 66 year old female He was seen in our office outpatient [facility] for a right proximal humerus fracture that progressed to a nonunion.  Patient had significant pain and pseudo-paresis of that right shoulder and was indicated for definitive treatment with a right reverse total shoulder arthroplasty.  Patient presented to the hospital on 05/07/2021 for surgery and was administered for postoperative pain control and monitoring. ? ?Laboratory Data: ?Admission on 05/07/2021, Discharged on 05/11/2021  ?Component Date Value Ref Range Status  ? Glucose-Capillary 05/07/2021 50 (L)  70 - 99 mg/dL Final  ? Glucose reference range applies only to samples taken after fasting for at least 8 hours.  ? Glucose-Capillary 05/07/2021 82  70 - 99 mg/dL Final  ? Glucose reference range applies only to samples taken after fasting for at least 8 hours.  ? Glucose-Capillary 05/07/2021  77  70 - 99 mg/dL Final  ? Glucose reference range applies only to samples taken after fasting for at least 8 hours.  ? WBC 05/07/2021 16.4 (H)  4.0 - 10.5 K/uL Final  ? RBC 05/07/2021 3.58 (L)  3.87 - 5.11 MIL/uL Final  ? Hemoglobin 05/07/2021 10.4 (L)  12.0 - 15.0 g/dL Final  ? HCT 05/07/2021 33.3 (L)  36.0 - 46.0 % Final  ? MCV 05/07/2021 93.0  80.0 - 100.0 fL Final  ? MCH 05/07/2021 29.1  26.0 - 34.0 pg Final  ? MCHC 05/07/2021 31.2  30.0 - 36.0 g/dL Final  ? RDW 05/07/2021 14.4  11.5 - 15.5 % Final  ? Platelets 05/07/2021 207  150 - 400 K/uL Final  ? nRBC 05/07/2021 0.0  0.0 - 0.2 % Final  ? Performed at [facility], [place] [address] [place], [place] [address]  ? Creatinine, Ser 05/07/2021 1.54 (H)  0.44 - 1.00 mg/dL Final  ? GFR, Estimated 05/07/2021 37 (L)  >60 mL/min Final  ? Comment: (NOTE) ?Calculated using the CKD-EPI Creatinine Equation (2021) ?Performed at [facility], [address] ?[place], [place] [address] ?  ? Glucose-Capillary 05/07/2021 107 (H)  70 - 99 mg/dL Final  ? Glucose reference range applies only to samples taken after fasting for at least 8 hours.  ? Glucose-Capillary 05/07/2021 252 (H)  70 - 99 mg/dL Final  ? Glucose reference range applies only to samples taken after fasting for at least 8 hours.  ? Hemoglobin 05/08/2021  9.2 (L)  12.0 - 15.0 g/dL Final  ? HCT 68/34/1962 29.6 (L)  36.0 - 46.0 % Final  ? Performed at [facility], [address], [place], [place] [address]  ? Sodium 05/08/2021 135  135 - 145 mmol/L Final  ? Potassium 05/08/2021 5.5 (H)  3.5 - 5.1 mmol/L Final  ? Chloride 05/08/2021 110  98 - 111 mmol/L Final  ? CO2 05/08/2021 20 (L)  22 - 32 mmol/L Final  ? Glucose, Bld 05/08/2021 249 (H)  70 - 99 mg/dL Final  ? Glucose reference range applies only to samples taken after fasting for at least 8 hours.  ? BUN 05/08/2021 50 (H)  8 - 23 mg/dL Final  ? Creatinine, Ser 05/08/2021 1.89 (H)  0.44 - 1.00 mg/dL Final  ?  Calcium 89/21/1941 8.3 (L)  8.9 - 10.3 mg/dL Final  ? GFR, Estimated 05/08/2021 29 (L)  >60 mL/min Final  ? Comment: (NOTE) ?Calculated using the CKD-EPI Creatinine Equation (2021) ?  ? Anion gap 05/08/2021 5  5 - 15 Final  ? Performed at [facility], [address], [place], [place] [address]  ? Glucose-Capillary 05/07/2021 393 (H)  70 - 99 mg/dL Final  ? Glucose reference range applies only to samples taken after fasting for at least 8 hours.  ? Glucose-Capillary 05/08/2021 182 (H)  70 - 99 mg/dL Final  ? Glucose reference range applies only to samples taken after fasting for at least 8 hours.  ? Glucose-Capillary 05/08/2021 184 (H)  70 - 99 mg/dL Final  ? Glucose reference range applies only to samples taken after fasting for at least 8 hours.  ? Glucose-Capillary 05/08/2021 146 (H)  70 - 99 mg/dL Final  ? Glucose reference range applies only to samples taken after fasting for at least 8 hours.  ? Sodium 05/09/2021 134 (L)  135 - 145 mmol/L Final  ? Potassium 05/09/2021 4.8  3.5 - 5.1 mmol/L Final  ? Chloride 05/09/2021 107  98 - 111 mmol/L Final  ? CO2 05/09/2021 20 (L)  22 - 32 mmol/L Final  ? Glucose, Bld 05/09/2021 203 (H)  70 - 99 mg/dL Final  ? Glucose reference range applies only to samples taken after fasting for at least 8 hours.  ? BUN 05/09/2021 59 (H)  8 - 23 mg/dL Final  ? Creatinine, Ser 05/09/2021 2.43 (H)  0.44 - 1.00 mg/dL Final  ? Calcium 44/81/8563 8.5 (L)  8.9 - 10.3 mg/dL Final  ? GFR, Estimated 05/09/2021 22 (L)  >60 mL/min Final  ? Comment: (NOTE) ?Calculated using the CKD-EPI Creatinine Equation (2021) ?  ? Anion gap 05/09/2021 7  5 - 15 Final  ? Performed at [facility], [address] [place], [place] [address]  ? Glucose-Capillary 05/08/2021 194 (H)  70 - 99 mg/dL Final  ? Glucose reference range applies only to samples taken after fasting for at least 8 hours.  ? Glucose-Capillary 05/09/2021 157 (H)  70 - 99 mg/dL Final  ? Glucose reference  range applies only to samples taken after fasting for at least 8 hours.  ? Glucose-Capillary 05/09/2021 112 (H)  70 - 99 mg/dL Final  ? Glucose reference range applies only to samples taken after fasting for at least 8 hours.  ? Glucose-Capillary 05/09/2021 113 (H)  70 - 99 mg/dL Final  ? Glucose reference range applies only to samples taken after fasting for at least 8 hours.  ? Glucose-Capillary 05/09/2021 141 (H)  70 - 99 mg/dL Final  ? Glucose reference range applies only to samples taken after fasting for at least 8 hours.  ? Sodium 05/10/2021 136  135 - 145 mmol/L Final  ? Potassium 05/10/2021 4.8  3.5 - 5.1 mmol/L Final  ? Chloride 05/10/2021 110  98 - 111 mmol/L Final  ? CO2 05/10/2021 20 (L)  22 - 32 mmol/L Final  ? Glucose, Bld 05/10/2021 131 (H)  70 - 99 mg/dL Final  ? Glucose reference range applies only to samples taken after fasting for at least 8 hours.  ? BUN 05/10/2021 55 (H)  8 - 23 mg/dL Final  ? Creatinine, Ser 05/10/2021 2.01 (H)  0.44 - 1.00 mg/dL Final  ? Calcium 05/10/2021 8.4 (L)  8.9 - 10.3 mg/dL Final  ? GFR, Estimated 05/10/2021 27 (L)  >60 mL/min Final  ? Comment: (NOTE) ?Calculated using the CKD-EPI Creatinine Equation (2021) ?  ? Anion gap 05/10/2021 6  5 - 15 Final  ? Performed at [facility], [place] [address] [place], [place] [address]  ? Glucose-Capillary 05/10/2021 126 (H)  70 - 99 mg/dL Final  ? Glucose reference range applies only to samples taken after fasting for at least 8 hours.  ? Glucose-Capillary 05/10/2021 120 (H)  70 - 99 mg/dL Final  ? Glucose reference range applies only to samples taken after fasting for at least 8 hours.  ? Glucose-Capillary 05/10/2021 148 (H)  70 - 99 mg/dL Final  ? Glucose reference range applies only to samples taken after fasting for at least 8 hours.  ? Sodium 05/11/2021 136  135 - 145 mmol/L Final  ? Potassium 05/11/2021 4.8  3.5 - 5.1 mmol/L Final  ? Chloride 05/11/2021 110  98 - 111 mmol/L Final  ? CO2 05/11/2021 20 (L)  22  - 32 mmol/L Final  ? Glucose, Bld 05/11/2021 143 (H)  70 - 99 mg/dL Final  ? Glucose reference range applies only to samples taken after fasting for at least 8 hours.  ? BUN 05/11/2021 48 (H)  8 - 23 mg/

## 2021-05-11 NOTE — Progress Notes (Signed)
Admit: 05/07/2021 ?LOS: 2 ? ?65F AKI on CKD3 in setting of 3/25 R elective shoulder surgery.   ? ?Subjective:  ?SCr downtrending to 1.6, baseline it seems ?Great UOP,  ?Renal US w/o chronic changes of obstruction ?Foley removed this AM, UOP and PVR was - ?DC today ? ?03/27 0701 - 03/28 0700 ?In: 1700 [P.O.:1700] ?Out: 3460 [Urine:3460] ? ?Filed Weights  ? 05/07/21 0726  ?Weight: 82.6 kg  ? ? ?Scheduled Meds: ? arformoterol  15 mcg Nebulization BID  ? And  ? umeclidinium bromide  1 puff Inhalation Daily  ? atorvastatin  40 mg Oral Daily  ? budesonide  0.25 mg Nebulization BID  ? carvedilol  12.5 mg Oral BID  ? Chlorhexidine Gluconate Cloth  6 each Topical Daily  ? citalopram  40 mg Oral Daily  ? diphenhydrAMINE  25 mg Oral Daily  ? docusate sodium  100 mg Oral BID  ? DULoxetine  40 mg Oral QHS  ? enoxaparin (LOVENOX) injection  40 mg Subcutaneous Q24H  ? famotidine  10 mg Oral Daily  ? gabapentin  300 mg Oral TID  ? insulin aspart  0-15 Units Subcutaneous TID WC  ? insulin aspart  0-5 Units Subcutaneous QHS  ? methocarbamol  1,000 mg Oral QHS  ? ?Continuous Infusions: ? sodium chloride 10 mL/hr at 05/07/21 1605  ? ?PRN Meds:.sodium chloride, acetaminophen, albuterol, HYDROcodone-acetaminophen, HYDROcodone-acetaminophen, menthol-cetylpyridinium **OR** phenol, metoCLOPramide **OR** metoCLOPramide (REGLAN) injection, morphine injection, ondansetron **OR** ondansetron (ZOFRAN) IV ? ?Current Labs: reviewed  ? ?Physical Exam:  Blood pressure (!) 146/57, pulse 79, temperature 97.7 ?F (36.5 ?C), temperature source Oral, resp. rate 18, height 5' 3.75" (1.619 m), weight 82.6 kg, SpO2 93 %. ?NAD ?RRR ?CTAB ant ?S/nt/nd ?No sig LEE ?Nonfocal, CN2-12 intact ? ?A ?AKI on CKD3 ?BL SCr aroudn 1.6, sees K Lee with [facility] ?Peak SCr 2.4 3/26, improved today to baseline ?Probably combination ARB/SGLT2 and ? BOO (pain meds, anesthesia?) ?Good UOP, passed TOV ?Renal US reassuring ?S/p R shoulder surgery 3/24 per  Ortho ? ?P ?Agree with DC ?She can f/u with Dr. Nedra Hai w/in the month ? ? ?Sabra Heck MD ?05/11/2021, 2:17 PM ? ?Recent Labs  ?Lab 05/09/21 ?0330 05/10/21 ?0703 05/11/21 ?0309  ?NA 134* 136 136  ?K 4.8 4.8 4.8  ?CL 107 110 110  ?CO2 20* 20* 20*  ?GLUCOSE 203* 131* 143*  ?BUN 59* 55* 48*  ?CREATININE 2.43* 2.01* 1.61*  ?CALCIUM 8.5* 8.4* 8.4*  ? ? ?Recent Labs  ?Lab 05/07/21 ?1252 05/08/21 ?0334  ?WBC 16.4*  --   ?HGB 10.4* 9.2*  ?HCT 33.3* 29.6*  ?MCV 93.0  --   ?PLT 207  --   ? ? ? ? ? ? ? ? ? ? ?  ?

## 2021-05-11 NOTE — Progress Notes (Signed)
?  Transition of Care (TOC) Screening Note ? ? ?Patient Details  ?Name: Alicia Shepard ?Date of Birth: [birth year] ? ? ?Transition of Care (TOC) CM/SW Contact:    ?Phyllip Claw, LCSW ?Phone Number: ?05/11/2021, 2:32 PM ? ? ? ?Transition of Care Department [facility]) has reviewed patient and no TOC needs have been identified at this time. We will continue to monitor patient advancement through interdisciplinary progression rounds. If new patient transition needs arise, please place a TOC consult. ? ? ?

## 2021-05-11 NOTE — Progress Notes (Signed)
Postvoid residuals measured is 0. Notified PA.  ?

## 2021-05-11 NOTE — Progress Notes (Signed)
Occupational Therapy Treatment ?Patient Details ?Name: Alicia Shepard ?MRN: [id number] ?DOB: 1955/06/17 ?Today's Date: 05/11/2021 ? ? ?History of present illness Patient is a 66 year old woman s/p right reverse TSA ?  ?OT comments ? Patient needing mod A out of bed for trunk support, at baseline sleeps in recliner. Patient unable to recall HEP and reports she was having hallucinations last night. Daughter is present and able to recall and pulls out hand out for patient. Patient needing increased time 2* pain and AAROM using L hand to assist with exercises listed below. Patient min A with hand held support to ambulate to bathroom. Daughter present and states she can stay with patient while she uses toilet. Educated patient/daughter per PA post op instructions at 2 weeks post surgery patient can use R upper extremity for "fork/knife" ADL use, daughter verbalize understanding. Unsure at this time if patient will discharge home today or tomorrow. Will continue to follow acutely.  ? ?Recommendations for follow up therapy are one component of a multi-disciplinary discharge planning process, led by the attending physician.  Recommendations may be updated based on patient status, additional functional criteria and insurance authorization. ?   ?Follow Up Recommendations ? Follow physician's recommendations for discharge plan and follow up therapies  ?  ?Assistance Recommended at Discharge Frequent or constant Supervision/Assistance  ?Patient can return home with the following ? A little help with walking and/or transfers;A lot of help with bathing/dressing/bathroom;Assistance with cooking/housework;Help with stairs or ramp for entrance ?  ?Equipment Recommendations ? None recommended by OT  ?  ?   ?Precautions / Restrictions Precautions ?Precautions: Shoulder ?Precaution Comments: Per PA follow Op note instructions and allow elbow, hand,wrist movement as well as scapular retractions: Per Op note:  For the first 2 weeks will be  essentially quiet with just pendulums and elbow range of motion.  We will allow her to begin using the arm at the second week for light computer work and knife and fork activities.  Then beginning at week 4 we will begin outpatient therapy working on active range of motion and then passive range of motion and active assist, to begin at week 6. ?Required Braces or Orthoses: Sling ?Restrictions ?Weight Bearing Restrictions: Yes ?RUE Weight Bearing: Non weight bearing  ? ? ?  ? ?Mobility Bed Mobility ?Overal bed mobility: Needs Assistance ?Bed Mobility: Supine to Sit ?  ?  ?Supine to sit: Mod assist, HOB elevated ?  ?  ?General bed mobility comments: To elevate trunk, sleeps in recliner at baseline ?  ? ? ?  ?  ?Balance Overall balance assessment: Needs assistance ?Sitting-balance support: Feet supported ?Sitting balance-Leahy Scale: Fair ?  ?  ?Standing balance support: Single extremity supported ?Standing balance-Leahy Scale: Poor ?  ?  ?  ?  ?  ?  ?  ?  ?  ?  ?  ?  ?   ? ?ADL either performed or assessed with clinical judgement  ? ?ADL Overall ADL's : Needs assistance/impaired ?  ?  ?  ?  ?  ?  ?  ?  ?  ?  ?  ?  ?Toilet Transfer: Minimal assistance;Ambulation ?Toilet Transfer Details (indicate cue type and reason): Patient unsteady needing min hand held assistance when ambulating to bathroom and cues to hold onto grab bar with L hand when sitting onto toilet ?  ?  ?  ?  ?Functional mobility during ADLs: Minimal assistance ?General ADL Comments: Patient's daughter present during session and is able to recall  precautions of shoulder. Patient seated on toilet with daughter to assist with toileting/donning clean gown. ?  ? ?Extremity/Trunk Assessment Upper Extremity Assessment ?Upper Extremity Assessment: RUE deficits/detail ?RUE Deficits / Details: R total shouler restrictions in place at this time. Edematous at R posterior elbow ?  ?  ?  ?  ?  ? ? ?Cognition Arousal/Alertness: Awake/alert ?Behavior During Therapy: [facility]  for tasks assessed/performed ?Overall Cognitive Status: Impaired/Different from baseline ?  ?  ?  ?  ?  ?  ?  ?  ?  ?  ?  ?  ?  ?  ?  ?  ?General Comments: Patient having hallucinations last night and is forgetful that she is in the hospital still vs being at home ?  ?  ?   ?Exercises Exercises: Shoulder ?Shoulder Exercises ?Elbow Flexion: AAROM, Right, 10 reps, Standing ?Elbow Extension: AAROM, Right, 10 reps, Standing ?Wrist Flexion: AAROM, Right, 10 reps, Seated ?Wrist Extension: AAROM, Right, 10 reps, Seated ?Digit Composite Flexion: AROM, Right, 10 reps, Seated ?Composite Extension: AROM, Right, 10 reps, Seated ?Other Exercises ?Other Exercises: Patient's daughter understanding of HEP however patient is unable to recall how to properly perform on her own, daughter says likely 2* hallucinations ? ?  ?   ?   ? ? ?Pertinent Vitals/ Pain       Pain Assessment ?Pain Assessment: Faces ?Faces Pain Scale: Hurts little more ?Pain Location: R shoulder ?Pain Descriptors / Indicators: Aching, Guarding, Grimacing, Jabbing ?Pain Intervention(s): Monitored during session, Premedicated before session ? ?   ?   ? ?Frequency ? Min 2X/week  ? ? ? ? ?  ?Progress Toward Goals ? ?OT Goals(current goals can now be found in the care plan section) ? Progress towards OT goals: Progressing toward goals ? ?Acute Rehab OT Goals ?Patient Stated Goal: Home with DTR support ?OT Goal Formulation: With patient/family ?Time For Goal Achievement: 05/22/21 ?Potential to Achieve Goals: Good ?ADL Goals ?Additional ADL Goal #1: Patient will verbalize understanding of all instruction and education prior to discharge.  ?Plan Discharge plan remains appropriate   ? ?   ?AM-PAC OT "6 Clicks" Daily Activity     ?Outcome Measure ? ? Help from another person eating meals?: A Little ?Help from another person taking care of personal grooming?: A Little ?Help from another person toileting, which includes using toliet, bedpan, or urinal?: A Little ?Help from  another person bathing (including washing, rinsing, drying)?: A Lot ?Help from another person to put on and taking off regular upper body clothing?: A Lot ?Help from another person to put on and taking off regular lower body clothing?: A Lot ?6 Click Score: 15 ? ?  ?End of Session Equipment Utilized During Treatment: Other (comment) (Sling) ? ?OT Visit Diagnosis: Muscle weakness (generalized) (M62.81) ?  ?Activity Tolerance Patient tolerated treatment well ?  ?Patient Left Other (comment);with family/visitor present (on toilet) ?  ?Nurse Communication Mobility status ?  ? ?   ? ?Time: [id number] ?OT Time Calculation (min): 30 min ? ?Charges: OT General Charges ?$OT Visit: 1 Visit ?OT Treatments ?$Self Care/Home Management : 8-22 mins ?$Therapeutic Exercise: 8-22 mins ? ?Delbert Phenix OT ?OT pager: [phone] ? ? ?Rosemary Holms ?05/11/2021, 2:16 PM ?

## 2021-05-11 NOTE — Progress Notes (Signed)
Discharge package printed and instructions given to patient and daughter. Verbalizes understanding. 

## 2021-05-12 ENCOUNTER — Encounter (HOSPITAL_COMMUNITY): Payer: Self-pay | Admitting: Orthopedic Surgery

## 2021-06-22 ENCOUNTER — Encounter: Payer: Self-pay | Admitting: Physical Therapy

## 2021-06-22 ENCOUNTER — Ambulatory Visit: Payer: Medicare HMO | Attending: Orthopedic Surgery | Admitting: Physical Therapy

## 2021-06-22 DIAGNOSIS — M6281 Muscle weakness (generalized): Secondary | ICD-10-CM | POA: Diagnosis present

## 2021-06-22 DIAGNOSIS — M25511 Pain in right shoulder: Secondary | ICD-10-CM | POA: Diagnosis present

## 2021-06-22 NOTE — Therapy (Signed)
?OUTPATIENT PHYSICAL THERAPY SHOULDER EVALUATION ? ? ?Patient Name: Alicia Shepard ?MRN: [id number] ?DOB:1955/04/12, 66 y.o., female ?Today's Date: 06/23/2021 ? ? PT End of Session - 06/22/21 1658   ? ? Visit Number 1   ? Number of Visits 17   ? Date for PT Re-Evaluation 08/17/21   ? Progress Note Due on Visit 10   ? PT Start Time 1603   ? PT Stop Time [id number]   ? PT Time Calculation (min) 42 min   ? Activity Tolerance Patient tolerated treatment well;Patient limited by pain   ? Behavior During Therapy [facility] for tasks assessed/performed   ? ?  ?  ? ?  ? ? ?Past Medical History:  ?Diagnosis Date  ? Anxiety   ? Arthritis   ? Chronic kidney disease   ? CKD stage 3  ? COPD (chronic obstructive pulmonary disease) ([place])   ? Depression   ? Diabetes mellitus without complication ([place])   ? Type 2  ? GERD (gastroesophageal reflux disease)   ? History of kidney stones   ? HOH (hard of hearing)   ? Hypertension   ? Neuromuscular disorder ([place])   ? neuropathy feet and hands  ? Pneumonia   ? ?Past Surgical History:  ?Procedure Laterality Date  ? BACK SURGERY    ? HERNIA REPAIR    ? x2  ? REVERSE SHOULDER ARTHROPLASTY Right 05/07/2021  ? Procedure: REVERSE SHOULDER ARTHROPLASTY;  Surgeon: Nicholes Stairs, MD;  Location: [facility];  Service: Orthopedics;  Laterality: Right;  ? ?Patient Active Problem List  ? Diagnosis Date Noted  ? 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 ? ?PCP: [facility] ? ?REFERRING PROVIDER: Nicholes Stairs, MD ? ?REFERRING DIAG: Z55.89 (ICD-10-CM) - Encounter for other orthopedic aftercare ?Right reverse total shoulder arthroplasty (05/07/21) ? ?THERAPY DIAG:  ?Muscle weakness (generalized) ? ?Acute pain of right shoulder ? ? ?ONSET DATE: 05/07/21 ? ?SUBJECTIVE:                                                                                                                                                                                     ? ?SUBJECTIVE STATEMENT: ?Pt is a 66 y/o female s/p  right reverse TSA on 05/07/21 after enduring a fall that resulted in a fracture to the right proximal humerus (fall and fx > 6 months ago). She is now 6 weeks post-op. She received therapy in the hospital as well as HH. She reports compliance with HEP provided by each however those mainly focused on wrist and elbow mobility. She has been very limited in  actively initiating use of right shoulder. She has progressed to feeding herself  using her right hand; she bathes and dresses herself with the exception of donning a bra. Her grandson has lowered her desk so it is more comfortable to use her computer. She is able to type on a keyboard however using the mouse has been challenging; pt reports significant pain after attempting to be on the computer for a couple hours. 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  ? ? ? ?PERTINENT HISTORY: ?Anxiety, arthritis, CKD stage 3, COPD, DM type 2, HOH, HTN, neuromuscular disorder (neuropathy in hands and feet), hx of back surgery and hernia repair x2 ? ? ? ?PAIN:  ?Are you having pain? Yes: NPRS scale: 4/10 ?Pain location: right shoulder and UE  ?Pain description: sensitive, tender, sharp, achy  ?Aggravating factors: pulling pants up, over use ?Relieving factors: Tramadol - takes 2 at a time, does not take every day, only as needed. ? ? ?PRECAUTIONS: Shoulder ? ? ?Postoperative Plan (found in MD post-op note): Tairra Bullis will remain in sling for approximately 6 weeks.  For the first 2 weeks will be essentially quiet with just pendulums and elbow range of motion.  We will allow her to begin using the arm at the second week for light computer work and knife and fork activities.  Then beginning at week 4 we will begin outpatient therapy working on active range of motion and then passive range of motion and active assist, to begin at week 6.  Due to her medical morbidities she will be admitted postoperatively for overnight observation.   Discharge home tomorrow in sling.  ? ?Need to follow up with MD on protocol.  ? ? ? ?FALLS:  ?Has patient fallen in last 6 months? No ? ?LIVING ENVIRONMENT: ?Lives with: lives with their family and lives with their daughter ?Lives in: House/apartment ?Stairs: Yes: Internal: 14 steps; on left going up ?Has following equipment at home: Gilford Rile - 4 wheeled - does not use  ? ?OCCUPATION: ?Works remote in Therapist, art  ? ?PLOF: Independent with basic ADLs and Needs assistance with homemaking ? ?PATIENT GOALS to be able to not be dependent, be able to return to driving, be able to return to work  ? ?OBJECTIVE:  ? ?DIAGNOSTIC FINDINGS:  ?X-ray -- "Stable chronic proximal right humeral fracture abutting the humeral component of the right shoulder arthroplasty" ? ?PATIENT SURVEYS:  ?Quick Dash 72.7% and FOTO 38/59 ? ?COGNITION: ? Overall cognitive status: Within functional limits for tasks assessed ?    ?SENSATION: ?Light touch: Impaired  - RUE more sensitive through C3-T2 dermatomes ? ?POSTURE: ?Forward head, rounded shoulders, mild kyphosis in supine  ? ?UPPER EXTREMITY ROM:  ? ?Active ROM Right ?06/23/2021 Left ?06/23/2021  ?Shoulder flexion 68   ?Shoulder extension    ?Shoulder abduction 50   ?Shoulder adduction    ?Shoulder internal rotation 45   ?Shoulder external rotation 0   ?Elbow flexion WFL   ?Elbow extension WFL   ?Wrist flexion WFL   ?Wrist extension WFL   ?Wrist ulnar deviation WFL   ?Wrist radial deviation WFL   ?Wrist pronation WFL   ?Wrist supination WFL   ?(Blank rows = not tested) ? ?UPPER EXTREMITY MMT: ? ?MMT Right ?06/23/2021 Left ?06/23/2021  ?Shoulder flexion NT 4  ?Shoulder extension NT  4+  ?Shoulder abduction NT 4  ?Shoulder adduction NT   ?Shoulder internal rotation NT 4+  ?Shoulder external rotation NT 4-  ?Middle trapezius NT   ?Lower trapezius NT   ?  Elbow flexion NT 5  ?Elbow extension NT 5  ?Wrist flexion    ?Wrist extension    ?Wrist ulnar deviation    ?Wrist radial deviation    ?Wrist  pronation    ?Wrist supination    ?Grip strength (lbs)    ?(Blank rows = not tested) ? ? ?JOINT MOBILITY TESTING:  ?NT - significant sensitivity to palpation ? ?PALPATION:  ?Sensitive to right anterior, lateral and posterior shoulder, lateral upper arm, forearm, wrist, hand.  ?Most significant sensitivity to shoulder and upper arm. ?  ?TODAY'S TREATMENT:  ?Table slides - shoulder flexion and abduction. Pt education to increase table height for increased ROM.  ? ? ?PATIENT EDUCATION: ?Education details: POC, HEP, encouraged to modulate pain prior to sessions  ?Person educated: Patient and Child(ren) ?Education method: Explanation and Demonstration ?Education comprehension: verbalized understanding and returned demonstration ? ? ?HOME EXERCISE PROGRAM: ?Table slides - shoulder flexion and abduction. ? ?ASSESSMENT: ? ?CLINICAL IMPRESSION: ?Patient is a 66 y.o. female who was seen today for physical therapy evaluation and treatment s/p right reverse TSA. She presents with limited AROM, strength, shoulder mobility, overall functional use, posture deficits and pain with all mobility. Per op-note, PT will plan to begin with AROM and progress to PROM and strengthening. PT to obtain a formal post-op note by surgeon. Pt presents with multiple limitations as listed above and will benefit from PT to improve functional use to allow return to work and improve QoL.  ? ? ?OBJECTIVE IMPAIRMENTS decreased ROM, decreased strength, hypomobility, impaired UE functional use, postural dysfunction, and pain.  ? ?ACTIVITY LIMITATIONS cleaning, community activity, driving, meal prep, occupation, and laundry.  ? ?PERSONAL FACTORS Age, Behavior pattern, Fitness, Time since onset of injury/illness/exacerbation, and 3+ comorbidities: anxiety, arthritis, CKD stage 3, COPD, DM type 2, HOH, HTN, neuromuscular disorder (neuropathy in hands and feet), hx of back surgery and hernia repair x2  are also affecting patient's functional outcome.   ? ? ?REHAB POTENTIAL: Good ? ?CLINICAL DECISION MAKING: Evolving/moderate complexity ? ?EVALUATION COMPLEXITY: Moderate ? ? ?GOALS: ?Goals reviewed with patient? Yes ? ?SHORT TERM GOALS: Target date: 07/21/2021   ? ?Patient wi

## 2021-06-23 NOTE — Addendum Note (Signed)
Addended by: Lavenia Atlas on: 06/23/2021 08:34 PM ? ? Modules accepted: Orders ? ?

## 2021-06-30 ENCOUNTER — Ambulatory Visit: Payer: Medicare HMO | Admitting: Physical Therapy

## 2021-06-30 ENCOUNTER — Encounter: Payer: Self-pay | Admitting: Physical Therapy

## 2021-06-30 DIAGNOSIS — M6281 Muscle weakness (generalized): Secondary | ICD-10-CM | POA: Diagnosis not present

## 2021-06-30 DIAGNOSIS — M25511 Pain in right shoulder: Secondary | ICD-10-CM

## 2021-06-30 NOTE — Therapy (Signed)
?OUTPATIENT PHYSICAL THERAPY TREATMENT NOTE ? ? ?Patient Name: Alicia Shepard ?MRN: [id number] ?DOB:12/29/1955, 66 y.o., female ?Today's Date: 06/30/2021 ? ?PCP: [facility] ?REFERRING PROVIDER: Nicholes Stairs, MD ? ?END OF SESSION:  ? PT End of Session - 06/30/21 1006   ? ? Visit Number 2   ? Number of Visits 17   ? Date for PT Re-Evaluation 08/17/21   ? Progress Note Due on Visit 10   ? PT Start Time 0802   ? PT Stop Time 0845   ? PT Time Calculation (min) 43 min   ? Activity Tolerance Patient tolerated treatment well;Patient limited by pain   ? Behavior During Therapy [facility] for tasks assessed/performed   ? ?  ?  ? ?  ? ? ?Past Medical History:  ?Diagnosis Date  ? Anxiety   ? Arthritis   ? Chronic kidney disease   ? CKD stage 3  ? COPD (chronic obstructive pulmonary disease) ([place])   ? Depression   ? Diabetes mellitus without complication ([place])   ? Type 2  ? GERD (gastroesophageal reflux disease)   ? History of kidney stones   ? HOH (hard of hearing)   ? Hypertension   ? Neuromuscular disorder ([place])   ? neuropathy feet and hands  ? Pneumonia   ? ?Past Surgical History:  ?Procedure Laterality Date  ? BACK SURGERY    ? HERNIA REPAIR    ? x2  ? REVERSE SHOULDER ARTHROPLASTY Right 05/07/2021  ? Procedure: REVERSE SHOULDER ARTHROPLASTY;  Surgeon: Nicholes Stairs, MD;  Location: [facility];  Service: Orthopedics;  Laterality: Right;  ? ?Patient Active Problem List  ? Diagnosis Date Noted  ? 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 ? ?REFERRING DIAG: Z47.89 (ICD-10-CM) - Encounter for other orthopedic aftercare ?Right reverse total shoulder arthroplasty (05/07/21) ? ?THERAPY DIAG:  ?Muscle weakness (generalized) ? ?Acute pain of right shoulder ? ?PERTINENT HISTORY: Pt is a 66 y/o female s/p right reverse TSA on 05/07/21 after enduring a fall that resulted in a fracture to the right proximal humerus (fall and fx > 6 months ago). She is now 6 weeks post-op. She received therapy in the hospital as well as  HH. She reports compliance with HEP provided by each however those mainly focused on wrist and elbow mobility. She has been very limited in  actively initiating use of right shoulder. She has progressed to feeding herself using her right hand; she bathes and dresses herself with the exception of donning a bra. Her grandson has lowered her desk so it is more comfortable to use her computer. She is able to type on a keyboard however using the mouse has been challenging; pt reports significant pain after attempting to be on the computer for a couple hours. 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 PMH: anxiety, arthritis, CKD stage 3, COPD, DM type 2, HOH, HTN, neuromuscular disorder (neuropathy in hands and feet), hx of back surgery and hernia repair x2. ? ?PRECAUTIONS:  Week 4: begin outpatient therapy working on active range of motion; Week 6: begin passive range of motion and active assist. ? ? ?SUBJECTIVE: Pt denies pain upon arrival. She is having difficulty with her hearing; states she slept wrong and that this is common. She states she was able to hold her great-grandson and chop some vegetables for the first time since surgery this past weekend. She returns to work tomorrow. States she is nervous for  her first PT treatment session. She has a follow up with her surgeon after therapy today. She is compliant with HEP; reports some pain with exercises. ? ?PAIN:  ?Are you having pain? Yes: NPRS scale: 0/10 ?Pain location: right shoulder ? ? ?OBJECTIVE: (objective measures completed at initial evaluation unless otherwise dated) ? ? ?DIAGNOSTIC FINDINGS:  ?X-ray -- "Stable chronic proximal right humeral fracture abutting the humeral component of the right shoulder arthroplasty" ?  ?PATIENT SURVEYS:  ?Quick Dash 72.7% and FOTO 38/59 ?  ?COGNITION: ?          Overall cognitive status: Within functional limits for tasks assessed ?                                  ?SENSATION: ?Light touch: Impaired  - RUE more sensitive through C3-T2 dermatomes ?  ?POSTURE: ?Forward head, rounded shoulders, mild kyphosis in supine  ?  ?UPPER EXTREMITY ROM:  ?  ?Active ROM Right ?06/23/2021 Left ?06/23/2021  ?Shoulder flexion 68    ?Shoulder extension      ?Shoulder abduction 50    ?Shoulder adduction      ?Shoulder internal rotation 45    ?Shoulder external rotation 0    ?Elbow flexion WFL    ?Elbow extension WFL    ?Wrist flexion WFL    ?Wrist extension WFL    ?Wrist ulnar deviation WFL    ?Wrist radial deviation WFL    ?Wrist pronation WFL    ?Wrist supination WFL    ?(Blank rows = not tested) ?  ?UPPER EXTREMITY MMT: ?  ?MMT Right ?06/23/2021 Left ?06/23/2021  ?Shoulder flexion NT 4  ?Shoulder extension NT  4+  ?Shoulder abduction NT 4  ?Shoulder adduction NT    ?Shoulder internal rotation NT 4+  ?Shoulder external rotation NT 4-  ?Middle trapezius NT    ?Lower trapezius NT    ?Elbow flexion NT 5  ?Elbow extension NT 5  ?Wrist flexion      ?Wrist extension      ?Wrist ulnar deviation      ?Wrist radial deviation      ?Wrist pronation      ?Wrist supination      ?Grip strength (lbs)      ?(Blank rows = not tested) ?  ?  ?JOINT MOBILITY TESTING:  ?NT - significant sensitivity to palpation ?  ?PALPATION:  ?Sensitive to right anterior, lateral and posterior shoulder, lateral upper arm, forearm, wrist, hand.  ?Most significant sensitivity to shoulder and upper arm. ?            ?TODAY'S TREATMENT:  ? ?Manual Therapy ?PROM in supine - right shoulder into flexion, abduction, IR, ER and supinated elbow extension - x12 minutes ? ?Therapeutic Exercises ?Following performed in hooklying, RUE: ?AAROM shoulder flexion using PVC, 2x10 ?AAROM shoulder ER using PVC, 2x10 ?AAROM shoulder abduction using PVC, 2x10 ? -PT guiding movement and supporting elbow to prevent elbow flexion ? ?Seated scapular retraction, 2x10 ?Seated bicep curls 1# DB, 2x10 ?  ?  ?PATIENT EDUCATION: ?Education details: POC, HEP,  encouraged to modulate pain prior to sessions  ?Person educated: Patient and Child(ren) ?Education method: Explanation and Demonstration ?Education comprehension: verbalized understanding and returned demonstration ?  ?  ?HOME EXERCISE PROGRAM: ?Access Code: [email or web address] ? ?Exercises ?- Seated Shoulder Flexion Towel Slide at Table Top  - 1 x daily - 7 x weekly - 2 sets - 10 reps -  3-5 seconds hold ?- Seated Shoulder Abduction Towel Slide at Table Top  - 1 x daily - 7 x weekly - 2 sets - 10 reps - 3-5 seconds hold ?- Supine Shoulder Flexion with Dowel  - 1 x daily - 7 x weekly - 2 sets - 10 reps ?- Supine Shoulder External Rotation with Dowel  - 1 x daily - 7 x weekly - 2 sets - 10 reps ?- Seated Scapular Retraction  - 1 x daily - 7 x weekly - 2 sets - 10 reps ?- Seated Single Arm Bicep Curls Supinated with Dumbbell  - 1 x daily - 7 x weekly - 2 sets - 10 reps ?  ? ?ASSESSMENT: ?  ?CLINICAL IMPRESSION: ?Pt is motivated throughout session, reports she wants to feel better for family vacation in July and a beach trip in September. Began session with PROM; pt is limited in all ranges of motion, able to achieve <90* of GH flexion and abduction. Pt guarded through part of manual therapy, PT encouraged relaxation via diaphragmatic breathing and imagery. ROM improved when pt was in control of movement during AAROM; she had difficulty performing AA abduction (left off of HEP until further practice). HEP created, handout given to pt. She expresses gratitude and appears more relaxed by the end of the session. Deficits remain in ROM, strength, GH and scapular mobility, posture and pain. Pt will benefit from PT to improve functional use to allow return to work and improve QoL.   ? ? ?  ?  ?OBJECTIVE IMPAIRMENTS decreased ROM, decreased strength, hypomobility, impaired UE functional use, postural dysfunction, and pain.  ?  ?ACTIVITY LIMITATIONS cleaning, community activity, driving, meal prep, occupation, and laundry.  ?  ?PERSONAL  FACTORS Age, Behavior pattern, Fitness, Time since onset of injury/illness/exacerbation, and 3+ comorbidities: anxiety, arthritis, CKD stage 3, COPD, DM type 2, HOH, HTN, neuromuscular disorder (neuropathy in hands and feet

## 2021-07-05 ENCOUNTER — Encounter: Payer: Self-pay | Admitting: Physical Therapy

## 2021-07-05 ENCOUNTER — Ambulatory Visit: Payer: Medicare HMO | Admitting: Physical Therapy

## 2021-07-05 DIAGNOSIS — M6281 Muscle weakness (generalized): Secondary | ICD-10-CM | POA: Diagnosis not present

## 2021-07-05 DIAGNOSIS — M25511 Pain in right shoulder: Secondary | ICD-10-CM

## 2021-07-05 NOTE — Therapy (Signed)
OUTPATIENT PHYSICAL THERAPY TREATMENT NOTE   Patient Name: Alicia Shepard MRN: [id number] DOB:28-Jun-1955, 66 y.o., female Today's Date: 07/05/2021  PCP: [facility] REFERRING PROVIDER: Nicholes Stairs, MD  END OF SESSION:   PT End of Session - 07/05/21 1858     Visit Number 3    Number of Visits 17    Date for PT Re-Evaluation 08/17/21    Progress Note Due on Visit 10    PT Start Time 1800    PT Stop Time 1845    PT Time Calculation (min) 45 min    Activity Tolerance Patient tolerated treatment well;Patient limited by pain    Behavior During Therapy WFL for tasks assessed/performed              Past Medical History:  Diagnosis Date   Anxiety    Arthritis    Chronic kidney disease    CKD stage 3   COPD (chronic obstructive pulmonary disease) ([place])    Depression    Diabetes mellitus without complication ([place])    Type 2   GERD (gastroesophageal reflux disease)    History of kidney stones    HOH (hard of hearing)    Hypertension    Neuromuscular disorder ([place])    neuropathy feet and hands   Pneumonia    Past Surgical History:  Procedure Laterality Date   BACK SURGERY     HERNIA REPAIR     x2   REVERSE SHOULDER ARTHROPLASTY Right 05/07/2021   Procedure: REVERSE SHOULDER ARTHROPLASTY;  Surgeon: Nicholes Stairs, MD;  Location: [facility];  Service: Orthopedics;  Laterality: Right;   Patient Active Problem List   Diagnosis Date Noted   S/P reverse total shoulder arthroplasty, 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 She is now 6 weeks post-op. She received therapy in the hospital as well as  HH. She reports compliance with HEP provided by each however those mainly focused on wrist and elbow mobility. She has been very limited in  actively initiating use of right shoulder. She has progressed to feeding herself using her right hand; she bathes and dresses herself with the exception of donning a bra. Her grandson has lowered her desk so it is more comfortable to use her computer. She is able to type on a keyboard however using the mouse has been challenging; pt reports significant pain after attempting to be on the computer for a couple hours. 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 PMH: anxiety, arthritis, CKD stage 3, COPD, DM type 2, HOH, HTN, neuromuscular disorder (neuropathy in hands and feet), hx of back surgery and hernia repair x2.  PRECAUTIONS:  Week 4: begin outpatient therapy working on active range of motion; Week 6: begin passive range of motion and active assist.   SUBJECTIVE: Pt reports significant pain in her cervical spine since returning to work last week. Currently she is reporting 4/10 pain in neck and 3/10 pain in her upper arm. Was unable to perform supine portion of HEP due to no one in her family being able to assist her with sitting up  from the bed. Took Tramadol this morning; states it didn't seem to help with pain relief.     PAIN:  Are you having pain? Yes: NPRS scale: 3/10 Pain location: right shoulder/upper arm   OBJECTIVE: (objective measures completed at initial evaluation unless otherwise dated)   DIAGNOSTIC FINDINGS:  X-ray -- "Stable chronic proximal right humeral fracture abutting the humeral component of the right shoulder arthroplasty"   PATIENT SURVEYS:  Quick Dash 72.7% and FOTO 38/59   COGNITION:           Overall cognitive status: Within functional limits for tasks assessed                                  SENSATION: Light touch: Impaired  - RUE more sensitive through C3-T2 dermatomes    POSTURE: Forward head, rounded shoulders, mild kyphosis in supine    UPPER EXTREMITY ROM:    Active ROM Right 06/23/2021 Left 06/23/2021  Shoulder flexion 68    Shoulder extension      Shoulder abduction 50    Shoulder adduction      Shoulder internal rotation 45    Shoulder external rotation 0    Elbow flexion WFL    Elbow extension WFL    Wrist flexion WFL    Wrist extension WFL    Wrist ulnar deviation WFL    Wrist radial deviation WFL    Wrist pronation WFL    Wrist supination WFL    (Blank rows = not tested)   UPPER EXTREMITY MMT:   MMT Right 06/23/2021 Left 06/23/2021  Shoulder flexion NT 4  Shoulder extension NT  4+  Shoulder abduction NT 4  Shoulder adduction NT    Shoulder internal rotation NT 4+  Shoulder external rotation NT 4-  Middle trapezius NT    Lower trapezius NT    Elbow flexion NT 5  Elbow extension NT 5  Wrist flexion      Wrist extension      Wrist ulnar deviation      Wrist radial deviation      Wrist pronation      Wrist supination      Grip strength (lbs)      (Blank rows = not tested)     JOINT MOBILITY TESTING:  NT - significant sensitivity to palpation   PALPATION:  Sensitive to right anterior, lateral and posterior shoulder, lateral upper arm, forearm, wrist, hand.  Most significant sensitivity to shoulder and upper arm.             TODAY'S TREATMENT:   Manual Therapy PROM in supine - right shoulder into flexion, abduction, IR, ER and supinated elbow extension; x6 minutes STM/TPR to bilateral upper traps and cervical extensors, R>L; pt in sitting; x8 minutes  Therapeutic Exercises Following performed in standing, RUE: AAROM shoulder flexion using PVC, 2x10 AAROM shoulder ER using PVC, 2x10 AAROM shoulder abduction using PVC, 2x10 SA row using YellowTB, RUE; x10    *not today* Seated scapular retraction, 2x10 Seated bicep curls 1# DB, 2x10     PATIENT EDUCATION: Education details: POC, HEP, encouraged to modulate  pain prior to sessions. Pt asked about TDN - PT explained intervention and purpose; stated would try less invasive with STM this session and discuss further in later sessions is necessary (pt seems to be sensitive to pain). Also educated on posture and long-term benefits and pain relief that could come with improved  posture at work.   Person educated: Patient and Child(ren) Education method: Customer service manager Education comprehension: verbalized understanding and returned demonstration     HOME EXERCISE PROGRAM: Access Code: [email or web address]  Altered HEP: supine AAROM changed to standing for improved pt compliance.     ASSESSMENT:   CLINICAL IMPRESSION: Pt demonstrates good effort throughout session. 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 She requires frequent cueing on tempo and control with AAROM as well as maintaining posture during STM and standing exercises. Unable to relax pt this date via breathing. She remained guarded limiting PROM and AAROM to <90d. HEP was altered from supine AAROM to standing for improved pt compliance as she is unable to perform bed mobility independently and no one at home was able to assist. Deficits remain in ROM, strength, GH and scapular mobility, posture and pain. Pt will benefit from PT to improve functional use to allow return to work and improve QoL.         OBJECTIVE IMPAIRMENTS decreased ROM, decreased strength, hypomobility, impaired UE functional use, postural dysfunction, and pain.    ACTIVITY LIMITATIONS cleaning, community activity, driving, meal prep, occupation, and laundry.    PERSONAL FACTORS Age, Behavior pattern, Fitness, Time since onset of injury/illness/exacerbation, and 3+  comorbidities: anxiety, arthritis, CKD stage 3, COPD, DM type 2, HOH, HTN, neuromuscular disorder (neuropathy in hands and feet), hx of back surgery and hernia repair x2  are also affecting patient's functional outcome.      REHAB POTENTIAL: Good   CLINICAL DECISION MAKING: Evolving/moderate complexity   EVALUATION COMPLEXITY: Moderate     GOALS: Goals reviewed with patient? Yes   SHORT TERM GOALS: Target date: 07/21/2021     Patient will be independent in home exercise program to improve strength/mobility for better functional independence with ADLs. Baseline: Goal status: INITIAL       LONG TERM GOALS: Target date: 7//4/23     Patient will increase FOTO score to equal to or greater than 59  to demonstrate statistically significant improvement in mobility and quality of life.  Baseline: 5/9 38 Goal status: INITIAL   2.  Pt will increase right shoulder flexion and abduction to >140 degrees for improved ability to perform overhead activities. Baseline:  5/39flexion 68d, abduction 50d Goal status: INITIAL   3.  Pt will increase strength of global shoulder to at least 4/5 in order to demonstrate improvement in strength and function Baseline: 5/9 NT at eval Goal status: INITIAL   4.  Pt will decrease quick DASH score by at least 8% in order to demonstrate clinically significant reduction in disability. Baseline: 5/9 72.7% Goal status: INITIAL     PLAN: PT FREQUENCY: 2x/week   PT DURATION: 8 weeks   PLANNED INTERVENTIONS: Therapeutic exercises, Therapeutic activity, Neuromuscular re-education, Balance training, Gait training, Patient/Family education, Joint mobilization, and Dry Needling   PLAN FOR NEXT SESSION: initiate AROM and gentle strengthening of right shoulder per protocol by surgeon; strengthen elbow, wrist and hand      Patrina Levering PT, DPT

## 2021-07-07 ENCOUNTER — Encounter: Payer: Self-pay | Admitting: Physical Therapy

## 2021-07-07 ENCOUNTER — Ambulatory Visit: Payer: Medicare HMO | Admitting: Physical Therapy

## 2021-07-07 DIAGNOSIS — M25511 Pain in right shoulder: Secondary | ICD-10-CM

## 2021-07-07 DIAGNOSIS — M6281 Muscle weakness (generalized): Secondary | ICD-10-CM | POA: Diagnosis not present

## 2021-07-07 NOTE — Therapy (Addendum)
OUTPATIENT PHYSICAL THERAPY TREATMENT NOTE   Patient Name: Alicia Shepard MRN: [id number] DOB:March 09, 1955, 66 y.o., female Today's Date: 07/07/2021  PCP: [facility] REFERRING PROVIDER: Nicholes Stairs, MD  END OF SESSION:   PT End of Session - 07/07/21 0848     Visit Number 4    Number of Visits 17    Date for PT Re-Evaluation 08/17/21    Progress Note Due on Visit 10    PT Start Time 0850    PT Stop Time 0930    PT Time Calculation (min) 40 min    Activity Tolerance Patient tolerated treatment well;Patient limited by pain    Behavior During Therapy [facility] for tasks assessed/performed               Past Medical History:  Diagnosis Date   Anxiety    Arthritis    Chronic kidney disease    CKD stage 3   COPD (chronic obstructive pulmonary disease) ([place])    Depression    Diabetes mellitus without complication ([place])    Type 2   GERD (gastroesophageal reflux disease)    History of kidney stones    HOH (hard of hearing)    Hypertension    Neuromuscular disorder ([place])    neuropathy feet and hands   Pneumonia    Past Surgical History:  Procedure Laterality Date   BACK SURGERY     HERNIA REPAIR     x2   REVERSE SHOULDER ARTHROPLASTY Right 05/07/2021   Procedure: REVERSE SHOULDER ARTHROPLASTY;  Surgeon: Nicholes Stairs, MD;  Location: [facility];  Service: Orthopedics;  Laterality: Right;   Patient Active Problem List   Diagnosis Date Noted   S/P reverse total shoulder arthroplasty, 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 She is now 6 weeks post-op. She received therapy in the hospital as well  as HH. She reports compliance with HEP provided by each however those mainly focused on wrist and elbow mobility. She has been very limited in  actively initiating use of right shoulder. She has progressed to feeding herself using her right hand; she bathes and dresses herself with the exception of donning a bra. Her grandson has lowered her desk so it is more comfortable to use her computer. She is able to type on a keyboard however using the mouse has been challenging; pt reports significant pain after attempting to be on the computer for a couple hours. 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 PMH: anxiety, arthritis, CKD stage 3, COPD, DM type 2, HOH, HTN, neuromuscular disorder (neuropathy in hands and feet), hx of back surgery and hernia repair x2.  PRECAUTIONS:  Week 4: begin outpatient therapy working on active range of motion; Week 6: begin passive range of motion and active assist.   SUBJECTIVE: Pt reports pain in lateral upper arm. She is taking 2 Tramadol every 6 hours however states it is not working. She reports 4-5/10 pain upon arrival. States her neck feels "not too bad"; she has been off work since Sunday. Compliant with HEP; no questions.  PAIN:  Are you having pain? Yes: NPRS scale: 3/10 Pain location: right shoulder/upper arm   OBJECTIVE: (objective measures completed at initial evaluation unless otherwise dated)   DIAGNOSTIC FINDINGS:  X-ray -- "Stable chronic proximal right humeral fracture abutting the humeral component of the right shoulder arthroplasty"   PATIENT SURVEYS:  Quick Dash 72.7% and FOTO 38/59   COGNITION:           Overall cognitive status: Within functional limits for tasks assessed                                  SENSATION: Light touch: Impaired  - RUE more sensitive through C3-T2 dermatomes   POSTURE: Forward head, rounded shoulders, mild kyphosis in supine    UPPER EXTREMITY ROM:    Active  ROM Right 06/23/2021 Left 06/23/2021  Shoulder flexion 68    Shoulder extension      Shoulder abduction 50    Shoulder adduction      Shoulder internal rotation 45    Shoulder external rotation 0    Elbow flexion WFL    Elbow extension WFL    Wrist flexion WFL    Wrist extension WFL    Wrist ulnar deviation WFL    Wrist radial deviation WFL    Wrist pronation WFL    Wrist supination WFL    (Blank rows = not tested)   UPPER EXTREMITY MMT:   MMT Right 06/23/2021 Left 06/23/2021  Shoulder flexion NT 4  Shoulder extension NT  4+  Shoulder abduction NT 4  Shoulder adduction NT    Shoulder internal rotation NT 4+  Shoulder external rotation NT 4-  Middle trapezius NT    Lower trapezius NT    Elbow flexion NT 5  Elbow extension NT 5  Wrist flexion      Wrist extension      Wrist ulnar deviation      Wrist radial deviation      Wrist pronation      Wrist supination      Grip strength (lbs)      (Blank rows = not tested)     JOINT MOBILITY TESTING:  NT - significant sensitivity to palpation   PALPATION:  Sensitive to right anterior, lateral and posterior shoulder, lateral upper arm, forearm, wrist, hand.  Most significant sensitivity to shoulder and upper arm.             TODAY'S TREATMENT:   Manual Therapy PROM in supine - right shoulder into flexion, abduction, IR, ER and horizontal adduction; x8 minutes Gentle oscillations for pain relief, RUE, x2 minutes Grades I and II joint mobs to right GH joint, inferior mobs and posterior mobs; performed for pain relief and relaxation; 3x30 seconds each   Therapeutic Exercises Following performed in supine, RUE: AAROM shoulder flexion using dowel, 2x10 AAROM shoulder ER using dowel, 2x10 AAROM shoulder abduction using dowel, 2x10 Seated scapular retraction, 2x10 Seated bicep curls 1# DB, 2x10 SA row using YellowTB, RUE; 2x10     PATIENT EDUCATION: Education details: POC, HEP, encouraged pt to reach out to MD to ask  about alternate medications for pain after pt repeatedly mentioned current prescription is not working.  Person educated: Patient and Child(ren) Education method: Customer service manager Education comprehension: verbalized understanding and returned demonstration     HOME EXERCISE PROGRAM: Access Code: [email or web address]     ASSESSMENT:   CLINICAL IMPRESSION: Increased manual interventions  and added techniques for pain modulation and relaxation due to guarding and frequent reports of pain. PROM improved following low grade mobs; however continues to be unable to achieve 90d flexion and abduction. AAROM was performed in supine as greater ROM is able to be achieved compared to standing. Scapular strengthening performed at end of session. Reiterated importance of posture during exercises as well as tonight when pt returns to work. Deficits remain in ROM, strength, GH and scapular mobility, posture and pain. Pt will benefit from PT to improve functional use to allow return to work and improve QoL.        OBJECTIVE IMPAIRMENTS decreased ROM, decreased strength, hypomobility, impaired UE functional use, postural dysfunction, and pain.    ACTIVITY LIMITATIONS cleaning, community activity, driving, meal prep, occupation, and laundry.    PERSONAL FACTORS Age, Behavior pattern, Fitness, Time since onset of injury/illness/exacerbation, and 3+ comorbidities: anxiety, arthritis, CKD stage 3, COPD, DM type 2, HOH, HTN, neuromuscular disorder (neuropathy in hands and feet), hx of back surgery and hernia repair x2  are also affecting patient's functional outcome.      REHAB POTENTIAL: Good   CLINICAL DECISION MAKING: Evolving/moderate complexity   EVALUATION COMPLEXITY: Moderate     GOALS: Goals reviewed with patient? Yes   SHORT TERM GOALS: Target date: 07/21/2021     Patient will be independent in home exercise program to improve strength/mobility for better functional independence with  ADLs. Baseline: Goal status: INITIAL       LONG TERM GOALS: Target date: 7//4/23     Patient will increase FOTO score to equal to or greater than 59  to demonstrate statistically significant improvement in mobility and quality of life.  Baseline: 5/9 38 Goal status: INITIAL   2.  Pt will increase right shoulder flexion and abduction to >140 degrees for improved ability to perform overhead activities. Baseline:  5/24flexion 68d, abduction 50d Goal status: INITIAL   3.  Pt will increase strength of global shoulder to at least 4/5 in order to demonstrate improvement in strength and function Baseline: 5/9 NT at eval Goal status: INITIAL   4.  Pt will decrease quick DASH score by at least 8% in order to demonstrate clinically significant reduction in disability. Baseline: 5/9 72.7% Goal status: INITIAL     PLAN: PT FREQUENCY: 2x/week   PT DURATION: 8 weeks   PLANNED INTERVENTIONS: Therapeutic exercises, Therapeutic activity, Neuromuscular re-education, Balance training, Gait training, Patient/Family education, Joint mobilization, and Dry Needling   PLAN FOR NEXT SESSION: initiate AROM and gentle strengthening of right shoulder per protocol by surgeon; strengthen elbow, wrist and hand. Perform gentle GH mobs and oscillations for pain relief as needed.       Patrina Levering PT, DPT

## 2021-07-14 ENCOUNTER — Encounter: Payer: Self-pay | Admitting: Physical Therapy

## 2021-07-14 ENCOUNTER — Ambulatory Visit: Payer: Medicare HMO | Admitting: Physical Therapy

## 2021-07-14 DIAGNOSIS — M6281 Muscle weakness (generalized): Secondary | ICD-10-CM

## 2021-07-14 DIAGNOSIS — M25511 Pain in right shoulder: Secondary | ICD-10-CM

## 2021-07-14 NOTE — Therapy (Addendum)
OUTPATIENT PHYSICAL THERAPY TREATMENT NOTE   Patient Name: Alicia Shepard MRN: [id number] DOB:[birth year], 66 y.o., female Today's Date: 07/14/2021  PCP: [facility] REFERRING PROVIDER: Nicholes Stairs, MD  END OF SESSION:   PT End of Session - 07/14/21 1235     Visit Number 5    Number of Visits 17    Date for PT Re-Evaluation 08/17/21    Progress Note Due on Visit 10    PT Start Time 0856    PT Stop Time 0930    PT Time Calculation (min) 34 min    Activity Tolerance Patient tolerated treatment well;Patient limited by pain    Behavior During Therapy [facility] for tasks assessed/performed                Past Medical History:  Diagnosis Date   Anxiety    Arthritis    Chronic kidney disease    CKD stage 3   COPD (chronic obstructive pulmonary disease) ([place])    Depression    Diabetes mellitus without complication ([facility])    Type 2   GERD (gastroesophageal reflux disease)    History of kidney stones    HOH (hard of hearing)    Hypertension    Neuromuscular disorder ([place])    neuropathy feet and hands   Pneumonia    Past Surgical History:  Procedure Laterality Date   BACK SURGERY     HERNIA REPAIR     x2   REVERSE SHOULDER ARTHROPLASTY Right 05/07/2021   Procedure: REVERSE SHOULDER ARTHROPLASTY;  Surgeon: Nicholes Stairs, MD;  Location: [facility];  Service: Orthopedics;  Laterality: Right;   Patient Active Problem List   Diagnosis Date Noted   S/P reverse total shoulder arthroplasty, 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 She is now 6 weeks post-op. She received therapy in the hospital as  well as HH. She reports compliance with HEP provided by each however those mainly focused on wrist and elbow mobility. She has been very limited in  actively initiating use of right shoulder. She has progressed to feeding herself using her right hand; she bathes and dresses herself with the exception of donning a bra. Her grandson has lowered her desk so it is more comfortable to use her computer. She is able to type on a keyboard however using the mouse has been challenging; pt reports significant pain after attempting to be on the computer for a couple hours. 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 PMH: anxiety, arthritis, CKD stage 3, COPD, DM type 2, HOH, HTN, neuromuscular disorder (neuropathy in hands and feet), hx of back surgery and hernia repair x2.  PRECAUTIONS:  Week 4: begin outpatient therapy working on active range of motion; Week 6: begin passive range of motion and active assist.   SUBJECTIVE: Pt reports being in the hospital over the weekend due to incessant vomiting. She is still having difficulty keeping nutrition down. Pt to follow up with gastroenterologist for endoscopy - needs to schedule appointment. Pt is now taking hydrocodone for pain management - she took one this morning prior to  PT. Reports 3/10 pain in right shoulder. She did have increased discomfort in shoulder following last session that lasted for 1 day.     PAIN:  Are you having pain? Yes: NPRS scale: 3/10 Pain location: right shoulder/upper arm   OBJECTIVE: (objective measures completed at initial evaluation unless otherwise dated)   DIAGNOSTIC FINDINGS:  X-ray -- "Stable chronic proximal right humeral fracture abutting the humeral component of the right shoulder arthroplasty"   PATIENT SURVEYS:  Quick Dash 72.7% and FOTO 38/59   COGNITION:           Overall cognitive status: Within functional limits for tasks assessed                                   SENSATION: Light touch: Impaired  - RUE more sensitive through C3-T2 dermatomes   POSTURE: Forward head, rounded shoulders, mild kyphosis in supine    UPPER EXTREMITY ROM:    Active ROM Right 06/23/2021 Left 06/23/2021  Shoulder flexion 68    Shoulder extension      Shoulder abduction 50    Shoulder adduction      Shoulder internal rotation 45    Shoulder external rotation 0    Elbow flexion WFL    Elbow extension WFL    Wrist flexion WFL    Wrist extension WFL    Wrist ulnar deviation WFL    Wrist radial deviation WFL    Wrist pronation WFL    Wrist supination WFL    (Blank rows = not tested)   UPPER EXTREMITY MMT:   MMT Right 06/23/2021 Left 06/23/2021  Shoulder flexion NT 4  Shoulder extension NT  4+  Shoulder abduction NT 4  Shoulder adduction NT    Shoulder internal rotation NT 4+  Shoulder external rotation NT 4-  Middle trapezius NT    Lower trapezius NT    Elbow flexion NT 5  Elbow extension NT 5  Wrist flexion      Wrist extension      Wrist ulnar deviation      Wrist radial deviation      Wrist pronation      Wrist supination      Grip strength (lbs)      (Blank rows = not tested)     JOINT MOBILITY TESTING:  NT - significant sensitivity to palpation   PALPATION:  Sensitive to right anterior, lateral and posterior shoulder, lateral upper arm, forearm, wrist, hand.  Most significant sensitivity to shoulder and upper arm.             TODAY'S TREATMENT:   Manual Therapy PROM in supine - right shoulder into flexion, abduction, IR, ER and horizontal adduction; x5 minutes Gentle oscillations for pain relief, RUE, x2 minutes Grades II and III joint mobs to right GH joint, inferior mobs and posterior mobs; performed for pain relief and relaxation; 3x30 seconds each   Therapeutic Exercises Following performed in supine, RUE: AAROM chest press using dowel, 2x10 AAROM shoulder flexion using dowel, 2x10    PT provided MOD A to perform sit<>supine  transfer.     *not today* AAROM shoulder ER using dowel, 2x10 AAROM shoulder abduction using dowel, 2x10 Seated scapular retraction, 2x10 Seated bicep curls 1# DB, 2x10 SA row using YellowTB, RUE; 2x10     PATIENT EDUCATION: Education details: POC, HEP Person educated: Patient and Child(ren) Education method: Explanation and Demonstration Education comprehension: verbalized understanding  and returned demonstration     HOME EXERCISE PROGRAM: Access Code: [email or web address]     ASSESSMENT:   CLINICAL IMPRESSION: Interventions limited by time (arrived 10 minutes late) and subjective explaining recent hospital visit. [place] ROM appears to improve during AAROM compared to PROM due to guarding. PT did perform [place] mobs, increasing to grade 2 and 3 with good patient tolerance to pain. With later repetitions pt was just shy of achieving 90d of shoulder flexion. Plan to check [facility] measurements with goni next session. PT did ask pt to perform [facility] ER and abduction at home later this date. Deficits remain in ROM, strength, GH and scapular mobility, posture and pain. Pt will benefit from PT to improve functional use to allow return to work and improve QoL.        OBJECTIVE IMPAIRMENTS decreased ROM, decreased strength, hypomobility, impaired UE functional use, postural dysfunction, and pain.    ACTIVITY LIMITATIONS cleaning, community activity, driving, meal prep, occupation, and laundry.    PERSONAL FACTORS Age, Behavior pattern, Fitness, Time since onset of injury/illness/exacerbation, and 3+ comorbidities: anxiety, arthritis, CKD stage 3, COPD, DM type 2, HOH, HTN, neuromuscular disorder (neuropathy in hands and feet), hx of back surgery and hernia repair x2  are also affecting patient's functional outcome.      REHAB POTENTIAL: Good   CLINICAL DECISION MAKING: Evolving/moderate complexity   EVALUATION COMPLEXITY: Moderate     GOALS: Goals reviewed with patient? Yes   SHORT TERM GOALS: Target  date: 07/21/2021     Patient will be independent in home exercise program to improve strength/mobility for better functional independence with ADLs. Baseline: Goal status: INITIAL       LONG TERM GOALS: Target date: 7//4/23     Patient will increase FOTO score to equal to or greater than 59  to demonstrate statistically significant improvement in mobility and quality of life.  Baseline: 5/9 38 Goal status: INITIAL   2.  Pt will increase right shoulder flexion and abduction to >140 degrees for improved ability to perform overhead activities. Baseline:  5/5flexion 68d, abduction 50d Goal status: INITIAL   3.  Pt will increase strength of global shoulder to at least 4/5 in order to demonstrate improvement in strength and function Baseline: 5/9 NT at eval Goal status: INITIAL   4.  Pt will decrease quick DASH score by at least 8% in order to demonstrate clinically significant reduction in disability. Baseline: 5/9 72.7% Goal status: INITIAL     PLAN: PT FREQUENCY: 2x/week   PT DURATION: 8 weeks   PLANNED INTERVENTIONS: Therapeutic exercises, Therapeutic activity, Neuromuscular re-education, Balance training, Gait training, Patient/Family education, Joint mobilization, and Dry Needling   PLAN FOR NEXT SESSION: initiate AROM and gentle strengthening of right shoulder as pt is able; strengthen elbow, wrist and hand. Perform gentle GH mobs and oscillations for pain relief as needed.       Patrina Levering PT, DPT

## 2021-07-19 ENCOUNTER — Ambulatory Visit: Payer: Medicare HMO | Attending: Orthopedic Surgery | Admitting: Physical Therapy

## 2021-07-19 ENCOUNTER — Encounter: Payer: Self-pay | Admitting: Physical Therapy

## 2021-07-19 DIAGNOSIS — M6281 Muscle weakness (generalized): Secondary | ICD-10-CM | POA: Insufficient documentation

## 2021-07-19 DIAGNOSIS — M25511 Pain in right shoulder: Secondary | ICD-10-CM | POA: Diagnosis present

## 2021-07-19 NOTE — Therapy (Signed)
OUTPATIENT PHYSICAL THERAPY TREATMENT NOTE   Patient Name: Alicia Shepard MRN: [id number] DOB:03-Nov-1955, 66 y.o., female Today's Date: 07/19/2021  PCP: [facility] REFERRING PROVIDER: Yolonda Kida, MD  END OF SESSION:   PT End of Session - 07/19/21 1715     Visit Number 6    Number of Visits 17    Date for PT Re-Evaluation 08/17/21    Progress Note Due on Visit 10    PT Start Time 1630    PT Stop Time 1715    PT Time Calculation (min) 45 min    Activity Tolerance Patient tolerated treatment well;Patient limited by pain    Behavior During Therapy WFL for tasks assessed/performed                 Past Medical History:  Diagnosis Date   Anxiety    Arthritis    Chronic kidney disease    CKD stage 3   COPD (chronic obstructive pulmonary disease) (HCC)    Depression    Diabetes mellitus without complication (HCC)    Type 2   GERD (gastroesophageal reflux disease)    History of kidney stones    HOH (hard of hearing)    Hypertension    Neuromuscular disorder (HCC)    neuropathy feet and hands   Pneumonia    Past Surgical History:  Procedure Laterality Date   BACK SURGERY     HERNIA REPAIR     x2   REVERSE SHOULDER ARTHROPLASTY Right 05/07/2021   Procedure: REVERSE SHOULDER ARTHROPLASTY;  Surgeon: Yolonda Kida, MD;  Location: [facility];  Service: Orthopedics;  Laterality: Right;   Patient Active Problem List   Diagnosis Date Noted   S/P reverse total shoulder arthroplasty, 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 She is now 6 weeks post-op. She received therapy in the hospital as  well as HH. She reports compliance with HEP provided by each however those mainly focused on wrist and elbow mobility. She has been very limited in  actively initiating use of right shoulder. She has progressed to feeding herself using her right hand; she bathes and dresses herself with the exception of donning a bra. Her grandson has lowered her desk so it is more comfortable to use her computer. She is able to type on a keyboard however using the mouse has been challenging; pt reports significant pain after attempting to be on the computer for a couple hours. 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 PMH: anxiety, arthritis, CKD stage 3, COPD, DM type 2, HOH, HTN, neuromuscular disorder (neuropathy in hands and feet), hx of back surgery and hernia repair x2.  PRECAUTIONS:  Week 4: begin outpatient therapy working on active range of motion; Week 6: begin passive range of motion and active assist.     OBJECTIVE: (objective measures completed at initial evaluation unless otherwise dated)   DIAGNOSTIC FINDINGS:  X-ray -- "Stable chronic proximal right humeral fracture abutting the humeral component of the right shoulder arthroplasty"   PATIENT SURVEYS:  Quick Dash 72.7% and FOTO 38/59   COGNITION:  Overall cognitive status: Within functional limits for tasks assessed                                  SENSATION: Light touch: Impaired  - RUE more sensitive through C3-T2 dermatomes   POSTURE: Forward head, rounded shoulders, mild kyphosis in supine    UPPER EXTREMITY ROM:    Active ROM Right 06/23/2021 Left 06/23/2021  Shoulder flexion 68    Shoulder extension      Shoulder abduction 50    Shoulder adduction      Shoulder internal rotation 45    Shoulder external rotation 0    Elbow flexion WFL    Elbow extension WFL    Wrist flexion WFL    Wrist extension WFL    Wrist ulnar deviation WFL    Wrist radial deviation WFL    Wrist  pronation WFL    Wrist supination WFL    (Blank rows = not tested)   UPPER EXTREMITY MMT:   MMT Right 06/23/2021 Left 06/23/2021  Shoulder flexion NT 4  Shoulder extension NT  4+  Shoulder abduction NT 4  Shoulder adduction NT    Shoulder internal rotation NT 4+  Shoulder external rotation NT 4-  Middle trapezius NT    Lower trapezius NT    Elbow flexion NT 5  Elbow extension NT 5  Wrist flexion      Wrist extension      Wrist ulnar deviation      Wrist radial deviation      Wrist pronation      Wrist supination      Grip strength (lbs)      (Blank rows = not tested)     JOINT MOBILITY TESTING:  NT - significant sensitivity to palpation   PALPATION:  Sensitive to right anterior, lateral and posterior shoulder, lateral upper arm, forearm, wrist, hand.  Most significant sensitivity to shoulder and upper arm.                 SUBJECTIVE: Pt reports her shoulder is feeling okay today. She took pain meds prior to session; rates pain 3/10 upon arrival. She reports not being able to see GI specialist until 10/30 and that she remains nauseous when not on anti-nausea medication. She was able to work a full week last week, the first time since returning to work.    PAIN:  Are you having pain? Yes: NPRS scale: 3/10 Pain location: right shoulder/upper arm    TODAY'S TREATMENT:   PROM Flexion 95d Abduction 75d ER 26  Manual Therapy Gentle oscillations for pain relief, RUE, x2 minutes Grades II and III joint mobs to right GH joint, inferior mobs and posterior mobs; performed for pain relief and relaxation; 3x30 seconds each    *not today* PROM in supine - right shoulder into flexion, abduction, IR, ER and horizontal adduction; x5 minutes   Therapeutic Exercises Pulleys, AAROM GH flexion, x2 minutes  Pulleys, AAROM GH abduction, x2 minutes  Following performed in supine, RUE: AAROM chest press using dowel, 2x10 AAROM shoulder flexion using dowel, 2x10 AAROM  shoulder ER using dowel, 2x10 AAROM shoulder abduction using dowel, 2x10 Wall ladder into GH flexion, x3 minutes   PT provided MOD A to perform sit<>supine transfer.     *not today* Seated scapular retraction, 2x10 Seated bicep curls 1# DB, 2x10 SA row using YellowTB, RUE; 2x10     PATIENT EDUCATION:  Education details: POC, HEP Person educated: Patient and Child(ren) Education method: Customer service manager Education comprehension: verbalized understanding and returned demonstration     HOME EXERCISE PROGRAM: Access Code: [email or web address]     ASSESSMENT:   CLINICAL IMPRESSION: Pt is agreeable throughout session. Introduced additional AAROM interventions this date including pulleys and wall ladder. Pt seems to achieve greater ROM when she is in control of RUE rather than PT during PROM. Right GH ROM has improved since initial evaluation in all planes; this being said, she still has plenty of room for growth as she is <90d in abduction and just past 90d in flexion. VC provided on technique and to limit compensations including trunk lean and elevated shoulder. Deficits remain in ROM, strength, GH and scapular mobility, posture and pain. Pt will benefit from PT to improve functional use to allow return to work and improve QoL.        OBJECTIVE IMPAIRMENTS decreased ROM, decreased strength, hypomobility, impaired UE functional use, postural dysfunction, and pain.    ACTIVITY LIMITATIONS cleaning, community activity, driving, meal prep, occupation, and laundry.    PERSONAL FACTORS Age, Behavior pattern, Fitness, Time since onset of injury/illness/exacerbation, and 3+ comorbidities: anxiety, arthritis, CKD stage 3, COPD, DM type 2, HOH, HTN, neuromuscular disorder (neuropathy in hands and feet), hx of back surgery and hernia repair x2  are also affecting patient's functional outcome.      REHAB POTENTIAL: Good   CLINICAL DECISION MAKING: Evolving/moderate complexity   EVALUATION  COMPLEXITY: Moderate     GOALS: Goals reviewed with patient? Yes   SHORT TERM GOALS: Target date: 07/21/2021     Patient will be independent in home exercise program to improve strength/mobility for better functional independence with ADLs. Baseline: Goal status: INITIAL       LONG TERM GOALS: Target date: 7//4/23     Patient will increase FOTO score to equal to or greater than 59  to demonstrate statistically significant improvement in mobility and quality of life.  Baseline: 5/9 38 Goal status: INITIAL   2.  Pt will increase right shoulder flexion and abduction to >140 degrees for improved ability to perform overhead activities. Baseline:  5/82flexion 68d, abduction 50d Goal status: INITIAL   3.  Pt will increase strength of global shoulder to at least 4/5 in order to demonstrate improvement in strength and function Baseline: 5/9 NT at eval Goal status: INITIAL   4.  Pt will decrease quick DASH score by at least 8% in order to demonstrate clinically significant reduction in disability. Baseline: 5/9 72.7% Goal status: INITIAL     PLAN: PT FREQUENCY: 2x/week   PT DURATION: 8 weeks   PLANNED INTERVENTIONS: Therapeutic exercises, Therapeutic activity, Neuromuscular re-education, Balance training, Gait training, Patient/Family education, Joint mobilization, and Dry Needling   PLAN FOR NEXT SESSION: Continue AAROM; initiate AROM and gentle strengthening of right shoulder as pt is able; strengthen periscapular area, elbow, wrist and hand. Perform gentle GH mobs and oscillations for pain relief as needed.       Patrina Levering PT, DPT

## 2021-07-21 ENCOUNTER — Ambulatory Visit: Payer: Medicare HMO | Admitting: Physical Therapy

## 2021-07-21 ENCOUNTER — Encounter: Payer: Self-pay | Admitting: Physical Therapy

## 2021-07-21 DIAGNOSIS — M25511 Pain in right shoulder: Secondary | ICD-10-CM

## 2021-07-21 DIAGNOSIS — M6281 Muscle weakness (generalized): Secondary | ICD-10-CM | POA: Diagnosis not present

## 2021-07-21 NOTE — Therapy (Addendum)
OUTPATIENT PHYSICAL THERAPY TREATMENT NOTE   Patient Name: Alicia Shepard MRN: [id number] DOB:1955-04-11, 66 y.o., female Today's Date: 07/21/2021  [facility]P: [facility] REFERRING PROVIDER: [facility]  END OF SESSION:   PT End of Session - 07/21/21 1249     Visit Number 7    Number of Visits 17    Date for PT Re-Evaluation 08/17/21    Progress Note Due on Visit 10    PT Start Time 0852    PT Stop Time 0935    PT Time Calculation (min) 43 min    Activity Tolerance Patient tolerated treatment well;Patient limited by pain    Behavior During Therapy [facility] for tasks assessed/performed             Past Medical History:  Diagnosis Date   Anxiety    Arthritis    Chronic kidney disease    CKD stage 3   COPD (chronic obstructive pulmonary disease) ([place])    Depression    Diabetes mellitus without complication (HCC)    Type 2   GERD (gastroesophageal reflux disease)    History of kidney stones    HOH (hard of hearing)    Hypertension    Neuromuscular disorder ([place])    neuropathy feet and hands   Pneumonia    Past Surgical History:  Procedure Laterality Date   BACK SURGERY     HERNIA REPAIR     x2   REVERSE SHOULDER ARTHROPLASTY Right 05/07/2021   Procedure: REVERSE SHOULDER ARTHROPLASTY;  Surgeon: Nicholes Stairs, MD;  Location: [facility];  Service: Orthopedics;  Laterality: Right;   Patient Active Problem List   Diagnosis Date Noted   S/P reverse total shoulder arthroplasty, 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 She is now 6 weeks post-op. She received therapy in the hospital as well as HH.  She reports compliance with HEP provided by each however those mainly focused on wrist and elbow mobility. She has been very limited in  actively initiating use of right shoulder. She has progressed to feeding herself using her right hand; she bathes and dresses herself with the exception of donning a bra. Her grandson has lowered her desk so it is more comfortable to use her computer. She is able to type on a keyboard however using the mouse has been challenging; pt reports significant pain after attempting to be on the computer for a couple hours. 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 PMH: anxiety, arthritis, CKD stage 3, COPD, DM type 2, HOH, HTN, neuromuscular disorder (neuropathy in hands and feet), hx of back surgery and hernia repair x2.   PRECAUTIONS:  Week 4: begin outpatient therapy working on active range of motion; Week 6: begin passive range of motion and active assist.         OBJECTIVE: (objective measures completed at initial evaluation unless otherwise dated)     DIAGNOSTIC FINDINGS:  X-ray -- "Stable chronic proximal right humeral fracture abutting the humeral component of the right shoulder arthroplasty"   PATIENT SURVEYS:  Quick Dash 72.7% and  FOTO 38/59   COGNITION:           Overall cognitive status: Within functional limits for tasks assessed                                  SENSATION: Light touch: Impaired  - RUE more sensitive through C3-T2 dermatomes   POSTURE: Forward head, rounded shoulders, mild kyphosis in supine    UPPER EXTREMITY ROM:    Active ROM Right 06/23/2021 Left 06/23/2021  Shoulder flexion 68    Shoulder extension      Shoulder abduction 50    Shoulder adduction      Shoulder internal rotation 45    Shoulder external rotation 0    Elbow flexion WFL    Elbow extension WFL    Wrist flexion WFL    Wrist extension WFL    Wrist ulnar deviation WFL    Wrist radial deviation WFL    Wrist  pronation WFL    Wrist supination WFL    (Blank rows = not tested)   UPPER EXTREMITY MMT:   MMT Right 06/23/2021 Left 06/23/2021  Shoulder flexion NT 4  Shoulder extension NT  4+  Shoulder abduction NT 4  Shoulder adduction NT    Shoulder internal rotation NT 4+  Shoulder external rotation NT 4-  Middle trapezius NT    Lower trapezius NT    Elbow flexion NT 5  Elbow extension NT 5  Wrist flexion      Wrist extension      Wrist ulnar deviation      Wrist radial deviation      Wrist pronation      Wrist supination      Grip strength (lbs)      (Blank rows = not tested)     JOINT MOBILITY TESTING:  NT - significant sensitivity to palpation   PALPATION:  Sensitive to right anterior, lateral and posterior shoulder, lateral upper arm, forearm, wrist, hand.  Most significant sensitivity to shoulder and upper arm.                  TREATMENT TODAY   SUBJECTIVE: Pt reports taking pain medicine prior to PT. Patient reports her shoulder/arm is feeling relatively well. More pain in R paracervical/UT region. Patient reports missing her HEP yesterday due to comorbid GI issues.      PAIN:  Are you having pain? Yes: NPRS scale: 3/10 Pain location: R upper trapezius region        TODAY'S TREATMENT:    PROM Flexion 110d Abduction 90d ER 30   Manual Therapy Gentle oscillations for pain relief, RUE, x2 minutes Grades II and III joint mobs to right GH joint, inferior mobs and posterior mobs; performed for pain relief and relaxation; 3x30 seconds each  Gentle PROM within patient tolerance for flexion, abduction, IR,  ER STM/DTM R upper trapezius General cervical spine distraction in supine; x10 second bouts, x 10 repetitions Passive R upper trapezius stretching; 2x30sec      *not today* PROM in supine - right shoulder into flexion, abduction, IR, ER and horizontal adduction; x5 minutes     Therapeutic Exercises  Following performed in supine, RUE: AAROM chest press  using dowel, 2x10 AAROM shoulder flexion using dowel, 2x10 AAROM shoulder ER using dowel, 2x10    Pt edu: Reviewed upper trapezius stretching to use at home, self-STM and avoidance of repeated shoulder hiking to reduce  load on scapular elevators  PT provided MOD A to perform sit<>supine transfer.     *next visit* Wall ladder into shoulder complex flexion, x3 minutes  3-way deltoid isometrics (shoulder flexion, ABD, EXT with no shoulder hyperextension)      *not today* AAROM shoulder abduction using dowel, 2x10 Pulleys, AAROM GH flexion, x2 minutes  Pulleys, AAROM GH abduction, x2 minutes Seated scapular retraction, 2x10 Seated bicep curls 1# DB, 2x10 SA row using YellowTB, RUE; 2x10       PATIENT EDUCATION: Education details: HEP review, discussed UT stretching (see above) Person educated: Patient and Child(ren) Education method: Explanation and Demonstration Education comprehension: verbalized understanding and returned demonstration     HOME EXERCISE PROGRAM: Access Code: [email or web address]       ASSESSMENT:   CLINICAL IMPRESSION: Pt is very pleasant and participates well with physical therapy. She demonstrates modestly improving shoulder elevation compared to previous visits and pt denies pain at [facility] or proximal arm. Pt primarily complains of pain along R upper trapezius and R paracervical region. Cervical spine compression does not worsen symptoms; she reports no notable pain with use of gentle general C-spine traction. Pt has taut and tender R medial UT. Pt responds well with manual therapy today and will benefit from continued specific stretching for this muscle group at home. Pt's prognosis is limited by comorbid GI symptoms that limit participation in active HEP. Deficits remain in ROM, strength, GH and scapular mobility, posture and pain. Pt will benefit from PT to improve functional use to allow return to work and improve QoL.          OBJECTIVE IMPAIRMENTS decreased  ROM, decreased strength, hypomobility, impaired UE functional use, postural dysfunction, and pain.    ACTIVITY LIMITATIONS cleaning, community activity, driving, meal prep, occupation, and laundry.    PERSONAL FACTORS Age, Behavior pattern, Fitness, Time since onset of injury/illness/exacerbation, and 3+ comorbidities: anxiety, arthritis, CKD stage 3, COPD, DM type 2, HOH, HTN, neuromuscular disorder (neuropathy in hands and feet), hx of back surgery and hernia repair x2  are also affecting patient's functional outcome.      REHAB POTENTIAL: Good   CLINICAL DECISION MAKING: Evolving/moderate complexity   EVALUATION COMPLEXITY: Moderate     GOALS: Goals reviewed with patient? Yes   SHORT TERM GOALS: Target date: 07/21/2021     Patient will be independent in home exercise program to improve strength/mobility for better functional independence with ADLs. Baseline: Goal status: INITIAL       LONG TERM GOALS: Target date: 7//4/23     Patient will increase FOTO score to equal to or greater than 59  to demonstrate statistically significant improvement in mobility and quality of life.  Baseline: 5/9 38 Goal status: INITIAL   2.  Pt will increase right shoulder flexion and abduction to >140 degrees for improved ability to perform overhead activities. Baseline:  5/38flexion 68d, abduction 50d Goal status: INITIAL   3.  Pt will increase strength of global shoulder to at least 4/5 in order to demonstrate improvement in strength and function Baseline: 5/9 NT at eval Goal status: INITIAL   4.  Pt will decrease quick DASH score by at least 8% in order to demonstrate clinically significant reduction in disability. Baseline: 5/9 72.7% Goal status: INITIAL     PLAN: PT FREQUENCY: 2x/week   PT DURATION: 8 weeks   PLANNED INTERVENTIONS: Therapeutic exercises, Therapeutic activity, Neuromuscular re-education, Balance training, Gait training, Patient/Family education, Joint mobilization, and  Dry Needling   PLAN FOR NEXT  SESSION: Continue AAROM; initiate AROM and gentle strengthening of right shoulder as pt is able; strengthen periscapular area, elbow, wrist and hand. Perform gentle GH mobs and oscillations for pain relief as needed.         Valentina Gu, PT, DPT [id number]  Eilleen Kempf, PT 07/21/2021, 1:03 PM

## 2021-07-26 ENCOUNTER — Ambulatory Visit: Payer: Medicare HMO | Admitting: Physical Therapy

## 2021-07-26 DIAGNOSIS — M6281 Muscle weakness (generalized): Secondary | ICD-10-CM

## 2021-07-26 DIAGNOSIS — M25511 Pain in right shoulder: Secondary | ICD-10-CM

## 2021-07-26 NOTE — Therapy (Signed)
OUTPATIENT PHYSICAL THERAPY TREATMENT NOTE   Patient Name: Alicia Shepard MRN: [id number] DOB:[birth year], 66 y.o., female Today's Date: 07/28/2021  PCP: [place] REFERRING PROVIDER: Nicholes Stairs, MD  END OF SESSION:   PT End of Session - 07/28/21 0523     Visit Number 8    Number of Visits 17    Date for PT Re-Evaluation 08/17/21    Progress Note Due on Visit 10    PT Start Time 0301    PT Stop Time 0345    PT Time Calculation (min) 44 min    Activity Tolerance Patient tolerated treatment well;Patient limited by pain    Behavior During Therapy WFL for tasks assessed/performed             Past Medical History:  Diagnosis Date   Anxiety    Arthritis    Chronic kidney disease    CKD stage 3   COPD (chronic obstructive pulmonary disease) ([place])    Depression    Diabetes mellitus without complication ([place])    Type 2   GERD (gastroesophageal reflux disease)    History of kidney stones    HOH (hard of hearing)    Hypertension    Neuromuscular disorder ([place])    neuropathy feet and hands   Pneumonia    Past Surgical History:  Procedure Laterality Date   BACK SURGERY     HERNIA REPAIR     x2   REVERSE SHOULDER ARTHROPLASTY Right 05/07/2021   Procedure: REVERSE SHOULDER ARTHROPLASTY;  Surgeon: Nicholes Stairs, MD;  Location: [facility];  Service: Orthopedics;  Laterality: Right;   Patient Active Problem List   Diagnosis Date Noted   S/P reverse total shoulder arthroplasty, 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 She is now 6 weeks post-op. She received therapy in the hospital as  well as HH. She reports compliance with HEP provided by each however those mainly focused on wrist and elbow mobility. She has been very limited in  actively initiating use of right shoulder. She has progressed to feeding herself using her right hand; she bathes and dresses herself with the exception of donning a bra. Her grandson has lowered her desk so it is more comfortable to use her computer. She is able to type on a keyboard however using the mouse has been challenging; pt reports significant pain after attempting to be on the computer for a couple hours. 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 PMH: anxiety, arthritis, CKD stage 3, COPD, DM type 2, HOH, HTN, neuromuscular disorder (neuropathy in hands and feet), hx of back surgery and hernia repair x2.   PRECAUTIONS:  Week 4: begin outpatient therapy working on active range of motion; Week 6: begin passive range of motion and active assist.         OBJECTIVE: (objective measures completed at initial evaluation unless otherwise dated)     DIAGNOSTIC FINDINGS:  X-ray -- "Stable chronic proximal right humeral fracture abutting the humeral component of the right shoulder arthroplasty"   PATIENT SURVEYS:  Quick Dash 72.7% and  FOTO 38/59   COGNITION:           Overall cognitive status: Within functional limits for tasks assessed                                  SENSATION: Light touch: Impaired  - RUE more sensitive through C3-T2 dermatomes   POSTURE: Forward head, rounded shoulders, mild kyphosis in supine    UPPER EXTREMITY ROM:    Active ROM Right 06/23/2021 Left 06/23/2021  Shoulder flexion 68    Shoulder extension      Shoulder abduction 50    Shoulder adduction      Shoulder internal rotation 45    Shoulder external rotation 0    Elbow flexion WFL    Elbow extension WFL    Wrist flexion WFL    Wrist extension WFL    Wrist ulnar deviation WFL    Wrist radial deviation WFL     Wrist pronation WFL    Wrist supination WFL    (Blank rows = not tested)   UPPER EXTREMITY MMT:   MMT Right 06/23/2021 Left 06/23/2021  Shoulder flexion NT 4  Shoulder extension NT  4+  Shoulder abduction NT 4  Shoulder adduction NT    Shoulder internal rotation NT 4+  Shoulder external rotation NT 4-  Middle trapezius NT    Lower trapezius NT    Elbow flexion NT 5  Elbow extension NT 5  Wrist flexion      Wrist extension      Wrist ulnar deviation      Wrist radial deviation      Wrist pronation      Wrist supination      Grip strength (lbs)      (Blank rows = not tested)     JOINT MOBILITY TESTING:  NT - significant sensitivity to palpation   PALPATION:  Sensitive to right anterior, lateral and posterior shoulder, lateral upper arm, forearm, wrist, hand.  Most significant sensitivity to shoulder and upper arm.                  TREATMENT TODAY   SUBJECTIVE: Pt reports ongoing GI issues for which she is awaiting follow-up with specialist. She reports no significant pain in R UT at arrival to PT. She reports doing better with use of R arm, but she still has difficulty performing activities at shoulder height.      PAIN:  Are you having pain? Yes: NPRS scale: 3/10 Pain location: R upper trapezius region        TODAY'S TREATMENT:    PROM Flexion 110d Abduction 90d ER 30   Manual Therapy Gentle oscillations for pain relief, RUE, x2 minutes Grades II and III joint mobs to right GH joint, inferior mobs and posterior mobs; performed for pain relief and relaxation; 3x30 seconds each  Gentle PROM within patient tolerance for flexion, abduction, IR,  ER  *not today* STM/DTM R upper trapezius General cervical spine distraction in supine; x10 second bouts, x 10 repetitions Passive R upper trapezius stretching; 2x30sec PROM in supine - right shoulder into flexion, abduction, IR, ER and horizontal adduction; x5 minutes     Therapeutic Exercises   Following  performed in supine, RUE: AAROM chest press using dowel, 2x10 Shoulder flexion AROM in supine; 2x10 AAROM shoulder ER using dowel, 2x10   Wall ladder into shoulder complex flexion, x3 minutes  PT provided MinA to perform sit<>supine transfer.      *next visit* 3-way deltoid isometrics (shoulder flexion, ABD, EXT with no shoulder hyperextension)       *not today* AAROM shoulder flexion using dowel, 2x10 AAROM shoulder abduction using dowel, 2x10 Pulleys, AAROM GH flexion, x2 minutes  Pulleys, AAROM GH abduction, x2 minutes Seated scapular retraction, 2x10 Seated bicep curls 1# DB, 2x10 SA row using YellowTB, RUE; 2x10       PATIENT EDUCATION: Education details: HEP review; discussed role of PT and expectations for ROM/return of function following rTSA Person educated: Patient and Child(ren) Education method: Customer service manager Education comprehension: verbalized understanding and returned demonstration     HOME EXERCISE PROGRAM: Access Code: [email or web address]       ASSESSMENT:   CLINICAL IMPRESSION: Pt is very pleasant and participates well with physical therapy. Patient demonstrates continued gradual improvement in shoulder elevation; pt still has significant weakness limiting AROM for flexion in supine up to 80 degrees only. She needs further work on restoration of AROM and deltoid strength. Deficits remain in ROM, strength, GH and scapular mobility, posture and pain. Pt will benefit from PT to improve functional use to allow return to work and improve QoL.          OBJECTIVE IMPAIRMENTS decreased ROM, decreased strength, hypomobility, impaired UE functional use, postural dysfunction, and pain.    ACTIVITY LIMITATIONS cleaning, community activity, driving, meal prep, occupation, and laundry.    PERSONAL FACTORS Age, Behavior pattern, Fitness, Time since onset of injury/illness/exacerbation, and 3+ comorbidities: anxiety, arthritis, CKD stage 3, COPD, DM  type 2, HOH, HTN, neuromuscular disorder (neuropathy in hands and feet), hx of back surgery and hernia repair x2  are also affecting patient's functional outcome.      REHAB POTENTIAL: Good   CLINICAL DECISION MAKING: Evolving/moderate complexity   EVALUATION COMPLEXITY: Moderate     GOALS: Goals reviewed with patient? Yes   SHORT TERM GOALS: Target date: 07/21/2021     Patient will be independent in home exercise program to improve strength/mobility for better functional independence with ADLs. Baseline: Goal status: INITIAL       LONG TERM GOALS: Target date: 7//4/23     Patient will increase FOTO score to equal to or greater than 59  to demonstrate statistically significant improvement in mobility and quality of life.  Baseline: 5/9 38 Goal status: INITIAL   2.  Pt will increase right shoulder flexion and abduction to >140 degrees for improved ability to perform overhead activities. Baseline:  5/22flexion 68d, abduction 50d Goal status: INITIAL   3.  Pt will increase strength of global shoulder to at least 4/5 in order to demonstrate improvement in strength and function Baseline: 5/9 NT at eval Goal status: INITIAL   4.  Pt will decrease quick DASH score by at least 8% in order to demonstrate clinically significant reduction in disability. Baseline: 5/9 72.7% Goal status: INITIAL     PLAN: PT FREQUENCY: 2x/week   PT DURATION: 8 weeks   PLANNED INTERVENTIONS: Therapeutic exercises, Therapeutic activity, Neuromuscular re-education, Balance training, Gait training, Patient/Family education, Joint mobilization, and Dry Needling   PLAN FOR NEXT SESSION: Continue AAROM; initiate AROM and gentle strengthening of right shoulder as pt is able; strengthen periscapular area, elbow, wrist and hand. Perform gentle GH mobs and oscillations for pain relief as needed.          Valentina Gu, PT, DPT [id number]  Eilleen Kempf, PT 07/28/2021, 5:38 AM

## 2021-07-28 ENCOUNTER — Encounter: Payer: Self-pay | Admitting: Physical Therapy

## 2021-07-28 ENCOUNTER — Ambulatory Visit: Payer: Medicare HMO | Admitting: Physical Therapy

## 2021-07-28 DIAGNOSIS — M6281 Muscle weakness (generalized): Secondary | ICD-10-CM

## 2021-07-28 DIAGNOSIS — M25511 Pain in right shoulder: Secondary | ICD-10-CM

## 2021-07-28 NOTE — Therapy (Signed)
OUTPATIENT PHYSICAL THERAPY TREATMENT NOTE   Patient Name: Alicia Shepard MRN: [id number] DOB:May 17, 1955, 66 y.o., female Today's Date: 07/28/2021  PCP: [place], [place] REFERRING PROVIDER: Nicholes Stairs, MD  END OF SESSION:   PT End of Session - 07/28/21 1013     Visit Number 9    Number of Visits 17    Date for PT Re-Evaluation 08/17/21    Progress Note Due on Visit 10    PT Start Time 0845    PT Stop Time 0931    PT Time Calculation (min) 46 min    Activity Tolerance Patient tolerated treatment well;Patient limited by pain    Behavior During Therapy [facility] for tasks assessed/performed             Past Medical History:  Diagnosis Date   Anxiety    Arthritis    Chronic kidney disease    CKD stage 3   COPD (chronic obstructive pulmonary disease) ([place])    Depression    Diabetes mellitus without complication ([place])    Type 2   GERD (gastroesophageal reflux disease)    History of kidney stones    HOH (hard of hearing)    Hypertension    Neuromuscular disorder ([place])    neuropathy feet and hands   Pneumonia    Past Surgical History:  Procedure Laterality Date   BACK SURGERY     HERNIA REPAIR     x2   REVERSE SHOULDER ARTHROPLASTY Right 05/07/2021   Procedure: REVERSE SHOULDER ARTHROPLASTY;  Surgeon: Nicholes Stairs, MD;  Location: [facility];  Service: Orthopedics;  Laterality: Right;   Patient Active Problem List   Diagnosis Date Noted   S/P reverse total shoulder arthroplasty, 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 She is now 6 weeks post-op. She received therapy in the hospital as  well as HH. She reports compliance with HEP provided by each however those mainly focused on wrist and elbow mobility. She has been very limited in  actively initiating use of right shoulder. She has progressed to feeding herself using her right hand; she bathes and dresses herself with the exception of donning a bra. Her grandson has lowered her desk so it is more comfortable to use her computer. She is able to type on a keyboard however using the mouse has been challenging; pt reports significant pain after attempting to be on the computer for a couple hours. 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 PMH: anxiety, arthritis, CKD stage 3, COPD, DM type 2, HOH, HTN, neuromuscular disorder (neuropathy in hands and feet), hx of back surgery and hernia repair x2.   PRECAUTIONS:  Week 4: begin outpatient therapy working on active range of motion; Week 6: begin passive range of motion and active assist.         OBJECTIVE: (objective measures completed at initial evaluation unless otherwise dated)     DIAGNOSTIC FINDINGS:  X-ray -- "Stable chronic proximal right humeral fracture abutting the humeral component of the right shoulder arthroplasty"   PATIENT SURVEYS:  Katina Dung  72.7% and FOTO 38/59   COGNITION:           Overall cognitive status: Within functional limits for tasks assessed                                  SENSATION: Light touch: Impaired  - RUE more sensitive through C3-T2 dermatomes   POSTURE: Forward head, rounded shoulders, mild kyphosis in supine    UPPER EXTREMITY ROM:    Active ROM Right 06/23/2021 Left 06/23/2021  Shoulder flexion 68    Shoulder extension      Shoulder abduction 50    Shoulder adduction      Shoulder internal rotation 45    Shoulder external rotation 0    Elbow flexion WFL    Elbow extension WFL    Wrist flexion WFL    Wrist extension WFL    Wrist ulnar deviation WFL    Wrist radial deviation WFL     Wrist pronation WFL    Wrist supination WFL    (Blank rows = not tested)   UPPER EXTREMITY MMT:   MMT Right 06/23/2021 Left 06/23/2021  Shoulder flexion NT 4  Shoulder extension NT  4+  Shoulder abduction NT 4  Shoulder adduction NT    Shoulder internal rotation NT 4+  Shoulder external rotation NT 4-  Middle trapezius NT    Lower trapezius NT    Elbow flexion NT 5  Elbow extension NT 5  Wrist flexion      Wrist extension      Wrist ulnar deviation      Wrist radial deviation      Wrist pronation      Wrist supination      Grip strength (lbs)      (Blank rows = not tested)     JOINT MOBILITY TESTING:  NT - significant sensitivity to palpation   PALPATION:  Sensitive to right anterior, lateral and posterior shoulder, lateral upper arm, forearm, wrist, hand.  Most significant sensitivity to shoulder and upper arm.                  TREATMENT TODAY   SUBJECTIVE: Pt reports notable soreness after last session. Pt denies pain at arrival today. She is using hydrodocone for pain management prior to PT sessions. Pt is compliant with HEP.      PAIN:  Are you having pain? Pt denies pain at arrival. NPRS scale: 0/10 Pain location: R upper trapezius region        TODAY'S TREATMENT:    PROM Flexion 110d Abduction 90d ER 30     Manual Therapy Gentle oscillations for pain relief, RUE, x2 minutes Grades II and III joint mobs to right GH joint, inferior mobs and posterior mobs; performed for pain relief and relaxation; 3x30 seconds each  Gentle PROM within patient tolerance for flexion, abduction, IR,  ER    *not today* STM/DTM R upper trapezius General cervical spine distraction in supine; x10 second bouts, x 10 repetitions Passive R upper trapezius stretching; 2x30sec PROM in supine - right shoulder into flexion, abduction, IR, ER and horizontal adduction; x5 minutes     Therapeutic Exercises  Upper body ergometer,4 minutes forward only (slow cadence) - for R  upper limb AAROM and soft tissue warm-up to improve muscle performance, improved soft tissue mobility/extensibility - subjective information gathered during this time    Following performed in supine, RUE: Rhythmic stabilization  at 90 deg forward elevation; 2x30 sec, light multi-directional perturbations Shoulder flexion AROM in supine; 2x10 AAROM shoulder ER using dowel, 2x10   2-way deltoid isometrics (shoulder flexion, ABD); x10, 5 sec      PT provided MinA to perform sit<>supine transfer.      *next visit* Wall ladder into shoulder complex flexion, x3 minutes        *not today* AAROM chest press using dowel, 2x10 AAROM shoulder flexion using dowel, 2x10 AAROM shoulder abduction using dowel, 2x10 Pulleys, AAROM GH flexion, x2 minutes  Pulleys, AAROM GH abduction, x2 minutes Seated scapular retraction, 2x10 Seated bicep curls 1# DB, 2x10 SA row using YellowTB, RUE; 2x10       PATIENT EDUCATION: Education details: HEP review; discussed role of PT and expectations for ROM/return of function following rTSA Person educated: Patient and Child(ren) Education method: Customer service manager Education comprehension: verbalized understanding and returned demonstration     HOME EXERCISE PROGRAM: Access Code: [email or web address]       ASSESSMENT:   CLINICAL IMPRESSION: Patient demonstrates excellent motivation during session today. She exhibits supine R shoulder flexion AROM to 90 deg, though this is only up to about 80 deg following fatigue from repeated AROM exercise. She does require intermittent verbal cueing to limit R shoulder hiking. Added deltoid isometrics today to promote muscle activation needed for shoulder elevation with expected poor function of RTC given Hx of rTSA. Patient needs further work on  R upper limb AROM and strengthening. Deficits remain in ROM, strength, GH and scapular mobility, posture and pain. Pt will benefit from PT to improve functional use to allow  return to work and improve QoL.          OBJECTIVE IMPAIRMENTS decreased ROM, decreased strength, hypomobility, impaired UE functional use, postural dysfunction, and pain.    ACTIVITY LIMITATIONS cleaning, community activity, driving, meal prep, occupation, and laundry.    PERSONAL FACTORS Age, Behavior pattern, Fitness, Time since onset of injury/illness/exacerbation, and 3+ comorbidities: anxiety, arthritis, CKD stage 3, COPD, DM type 2, HOH, HTN, neuromuscular disorder (neuropathy in hands and feet), hx of back surgery and hernia repair x2  are also affecting patient's functional outcome.      REHAB POTENTIAL: Good   CLINICAL DECISION MAKING: Evolving/moderate complexity   EVALUATION COMPLEXITY: Moderate     GOALS: Goals reviewed with patient? Yes   SHORT TERM GOALS: Target date: 07/21/2021     Patient will be independent in home exercise program to improve strength/mobility for better functional independence with ADLs. Baseline: Goal status: INITIAL       LONG TERM GOALS: Target date: 7//4/23     Patient will increase FOTO score to equal to or greater than 59  to demonstrate statistically significant improvement in mobility and quality of life.  Baseline: 5/9 38 Goal status: INITIAL   2.  Pt will increase right shoulder flexion and abduction to >140 degrees for improved ability to perform overhead activities. Baseline:  5/12flexion 68d, abduction 50d Goal status: INITIAL   3.  Pt will increase strength of global shoulder to at least 4/5 in order to demonstrate improvement in strength and function Baseline: 5/9 NT at eval Goal status: INITIAL   4.  Pt will decrease quick DASH score by at least 8% in order to demonstrate clinically significant reduction in disability. Baseline: 5/9 72.7% Goal status: INITIAL     PLAN: PT FREQUENCY: 2x/week   PT DURATION: 8 weeks   PLANNED INTERVENTIONS: Therapeutic exercises, Therapeutic activity, Neuromuscular re-education,  Balance training, Gait training, Patient/Family education, Joint mobilization, and Dry Needling   PLAN FOR NEXT SESSION: Continue AAROM; initiate AROM and gentle strengthening of right shoulder as pt is able; strengthen periscapular area, elbow, wrist and hand. Perform gentle GH mobs and oscillations for pain relief as needed.          Valentina Gu, PT, DPT [id number]  Eilleen Kempf, PT 07/28/2021, 10:14 AM

## 2021-08-02 ENCOUNTER — Encounter: Payer: Medicare HMO | Admitting: Physical Therapy

## 2021-08-04 ENCOUNTER — Ambulatory Visit: Payer: Medicare HMO | Admitting: Physical Therapy

## 2021-08-04 ENCOUNTER — Encounter: Payer: Self-pay | Admitting: Physical Therapy

## 2021-08-04 DIAGNOSIS — M6281 Muscle weakness (generalized): Secondary | ICD-10-CM

## 2021-08-04 DIAGNOSIS — M25511 Pain in right shoulder: Secondary | ICD-10-CM

## 2021-08-04 NOTE — Therapy (Unsigned)
OUTPATIENT PHYSICAL THERAPY TREATMENT AND PROGRESS NOTE   Dates of reporting period  06/22/21   to   08/04/21    Patient Name: Alicia Shepard MRN: [id number] DOB:Jan 16, 1956, 66 y.o., female Today's Date: 08/04/2021  PCP: [facility] REFERRING PROVIDER: [facility]  END OF SESSION:   PT End of Session - 08/04/21 0846     Visit Number 10    Number of Visits 17    Date for PT Re-Evaluation 08/17/21    Progress Note Due on Visit 10    PT Start Time 0846    PT Stop Time 0928    PT Time Calculation (min) 42 min    Activity Tolerance Patient tolerated treatment well;Patient limited by pain    Behavior During Therapy [facility] for tasks assessed/performed             Past Medical History:  Diagnosis Date   Anxiety    Arthritis    Chronic kidney disease    CKD stage 3   COPD (chronic obstructive pulmonary disease) ([place])    Depression    Diabetes mellitus without complication ([place])    Type 2   GERD (gastroesophageal reflux disease)    History of kidney stones    HOH (hard of hearing)    Hypertension    Neuromuscular disorder ([place])    neuropathy feet and hands   Pneumonia    Past Surgical History:  Procedure Laterality Date   BACK SURGERY     HERNIA REPAIR     x2   REVERSE SHOULDER ARTHROPLASTY Right 05/07/2021   Procedure: REVERSE SHOULDER ARTHROPLASTY;  Surgeon: Nicholes Stairs, MD;  Location: [facility];  Service: Orthopedics;  Laterality: Right;   Patient Active Problem List   Diagnosis Date Noted   S/P reverse total shoulder arthroplasty, 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 She is  now 6 weeks post-op. She received therapy in the hospital as well as HH. She reports compliance with HEP provided by each however those mainly focused on wrist and elbow mobility. She has been very limited in  actively initiating use of right shoulder. She has progressed to feeding herself using her right hand; she bathes and dresses herself with the exception of donning a bra. Her grandson has lowered her desk so it is more comfortable to use her computer. She is able to type on a keyboard however using the mouse has been challenging; pt reports significant pain after attempting to be on the computer for a couple hours. 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 PMH: anxiety, arthritis, CKD stage 3, COPD, DM type 2, HOH, HTN, neuromuscular disorder (neuropathy in hands and feet), hx of back surgery and hernia repair x2.   PRECAUTIONS:  Week 4: begin outpatient therapy working on active range of motion; Week 6: begin passive range of motion and active assist.         OBJECTIVE: (objective measures completed at initial evaluation unless otherwise dated)     DIAGNOSTIC FINDINGS:  X-ray -- "Stable chronic proximal right humeral fracture abutting  the humeral component of the right shoulder arthroplasty"   PATIENT SURVEYS:  Quick Dash: eval 72.7%,  08/04/21: 20.5% FOTO: eval 38/59,  08/04/21: 54/59   COGNITION:           Overall cognitive status: Within functional limits for tasks assessed                                  SENSATION: Light touch: Impaired  - RUE more sensitive through C3-T2 dermatomes   POSTURE: Forward head, rounded shoulders, mild kyphosis in supine    UPPER EXTREMITY ROM:    Active ROM Right 06/23/2021 Left 06/23/2021 Right 08/04/21  Shoulder flexion 68   52  Shoulder extension       Shoulder abduction 50   52  Shoulder adduction       Shoulder internal rotation 45   WFL  Shoulder external rotation 0   45  Elbow flexion WFL      Elbow extension WFL     Wrist flexion WFL     Wrist extension WFL     Wrist ulnar deviation WFL     Wrist radial deviation WFL     Wrist pronation WFL     Wrist supination WFL     (Blank rows = not tested)   UPPER EXTREMITY MMT:   MMT Right 06/23/2021 Left 06/23/2021 Right 08/04/21  Shoulder flexion NT 4 2- (3+ resisted test)  Shoulder extension NT  4+   Shoulder abduction NT 4 2- (3+ resisted test)  Shoulder adduction NT     Shoulder internal rotation NT 4+ 4+  Shoulder external rotation NT 4- 4-  Middle trapezius NT     Lower trapezius NT     Elbow flexion NT 5 4+  Elbow extension NT 5 4  Wrist flexion       Wrist extension       Wrist ulnar deviation       Wrist radial deviation       Wrist pronation       Wrist supination       Grip strength (lbs)       (Blank rows = not tested)     JOINT MOBILITY TESTING:  NT - significant sensitivity to palpation   PALPATION:  Sensitive to right anterior, lateral and posterior shoulder, lateral upper arm, forearm, wrist, hand.  Most significant sensitivity to shoulder and upper arm.                  TREATMENT TODAY   SUBJECTIVE: Pt reports 75% SANE score presently. Patient reports improvement with household chores e.g. folding sheets an d being able o reach cross body e.g. putting on deodorant. Pt plans to cook dinner next week. Patient reports no pain at arrival - she is on Rx pain medicine. Pt is not taking pain pills during the day otherwise. Patient reports limitations with reaching and bringing arm back down to neutral position from elevated position. Patient reports improved ability to hold banister with stair descent. Pt is able to pet her dog and grab her bird.    PAIN:  Are you having pain? Pt denies pain at arrival. NPRS scale: 0/10 Pain location: R upper trapezius region        TODAY'S TREATMENT:     Therapeutic Activities  Re-assessment performed (see Objective and updated Goal section below)       Manual Therapy Gentle oscillations for pain  relief, RUE, x2 minutes Grades II and III joint mobs to right GH joint, inferior mobs and posterior mobs; performed for pain relief and relaxation; 3x30 seconds each  Gentle PROM within patient tolerance for flexion, abduction, IR,  ER     *not today* STM/DTM R upper trapezius General cervical spine distraction in supine; x10 second bouts, x 10 repetitions Passive R upper trapezius stretching; 2x30sec PROM in supine - right shoulder into flexion, abduction, IR, ER and horizontal adduction; x5 minutes     Therapeutic Exercises   Upper body ergometer,4 minutes forward only (slow cadence) - for R upper limb AAROM and soft tissue warm-up to improve muscle performance, improved soft tissue mobility/extensibility - subjective information gathered during this time     Following performed in supine, RUE: Rhythmic stabilization at 90 deg forward elevation; 2x30 sec, light multi-directional perturbations Shoulder flexion AROM in supine; 2x10 AAROM shoulder ER using dowel, 2x10   2-way deltoid isometrics (shoulder flexion, ABD); x10, 5 sec       PT provided MinA to perform sit<>supine transfer.      *next visit* Wall ladder into shoulder complex flexion, x3 minutes        *not today* AAROM chest press using dowel, 2x10 AAROM shoulder flexion using dowel, 2x10 AAROM shoulder abduction using dowel, 2x10 Pulleys, AAROM GH flexion, x2 minutes  Pulleys, AAROM GH abduction, x2 minutes Seated scapular retraction, 2x10 Seated bicep curls 1# DB, 2x10 SA row using YellowTB, RUE; 2x10       PATIENT EDUCATION: Education details: HEP review; discussed role of PT and expectations for ROM/return of function following rTSA Person educated: Patient and Child(ren) Education method: Customer service manager Education comprehension: verbalized understanding and returned demonstration     HOME EXERCISE PROGRAM: Access Code: [email or web address]        ASSESSMENT:   CLINICAL IMPRESSION: Patient demonstrates excellent motivation during session today. She exhibits supine R shoulder flexion AROM to 90 deg, though this is only up to about 80 deg following fatigue from repeated AROM exercise. She does require intermittent verbal cueing to limit R shoulder hiking. Added deltoid isometrics today to promote muscle activation needed for shoulder elevation with expected poor function of RTC given Hx of rTSA. Patient needs further work on  R upper limb AROM and strengthening. Deficits remain in ROM, strength, GH and scapular mobility, posture and pain. Pt will benefit from PT to improve functional use to allow return to work and improve QoL.          OBJECTIVE IMPAIRMENTS decreased ROM, decreased strength, hypomobility, impaired UE functional use, postural dysfunction, and pain.    ACTIVITY LIMITATIONS cleaning, community activity, driving, meal prep, occupation, and laundry.    PERSONAL FACTORS Age, Behavior pattern, Fitness, Time since onset of injury/illness/exacerbation, and 3+ comorbidities: anxiety, arthritis, CKD stage 3, COPD, DM type 2, HOH, HTN, neuromuscular disorder (neuropathy in hands and feet), hx of back surgery and hernia repair x2  are also affecting patient's functional outcome.      REHAB POTENTIAL: Good   CLINICAL DECISION MAKING: Evolving/moderate complexity   EVALUATION COMPLEXITY: Moderate     GOALS: Goals reviewed with patient? Yes   SHORT TERM GOALS: Target date: 07/21/2021     Patient will be independent in home exercise program to improve strength/mobility for better functional independence with ADLs. Baseline: 08/04/21: Pt is compliant with HEP, pt is performing AROM exercises at lower volume due to fatigability of R arm.  Goal status: IN PROGRESS  LONG TERM GOALS: Target date: 7//4/23     Patient will increase FOTO score to equal to or greater than 59  to demonstrate statistically significant improvement in  mobility and quality of life.  Baseline: 5/9 38.  6/21 54 Goal status: IN PROGRESS    2.  Pt will increase right shoulder flexion and abduction to >140 degrees for improved ability to perform overhead activities. Baseline:  5/9 flexion 68d, abduction 50d.  6/21 flexion 52d, abduction 52d Goal status: NOT MET   3.  Pt will increase strength of global shoulder to at least 4/5 in order to demonstrate improvement in strength and function Baseline: 5/9: NT at eval.  6/21:  R shoulder: 2- for flexion/ABD, 4- ER, 4+ IR Goal status: IN PROGRESS   4.  Pt will decrease quick DASH score by at least 8% in order to demonstrate clinically significant reduction in disability. Baseline: 5/9 72.7%.  6/21: 20.5% Goal status: ACHIEVED    PLAN: PT FREQUENCY: 2x/week   PT DURATION: 8 weeks   PLANNED INTERVENTIONS: Therapeutic exercises, Therapeutic activity, Neuromuscular re-education, Balance training, Gait training, Patient/Family education, Joint mobilization, and Dry Needling   PLAN FOR NEXT SESSION: Continue AAROM; initiate AROM and gentle strengthening of right shoulder as pt is able; strengthen periscapular area, elbow, wrist and hand. Perform gentle GH mobs and oscillations for pain relief as needed.               Eilleen Kempf, PT 08/04/2021, 8:46 AM

## 2021-08-09 ENCOUNTER — Encounter: Payer: Self-pay | Admitting: Physical Therapy

## 2021-08-09 ENCOUNTER — Ambulatory Visit: Payer: Medicare HMO | Admitting: Physical Therapy

## 2021-08-09 DIAGNOSIS — M6281 Muscle weakness (generalized): Secondary | ICD-10-CM

## 2021-08-09 DIAGNOSIS — M25511 Pain in right shoulder: Secondary | ICD-10-CM

## 2021-08-11 ENCOUNTER — Encounter: Payer: Self-pay | Admitting: Physical Therapy

## 2021-08-11 ENCOUNTER — Ambulatory Visit: Payer: Medicare HMO | Admitting: Physical Therapy

## 2021-08-11 DIAGNOSIS — M25511 Pain in right shoulder: Secondary | ICD-10-CM

## 2021-08-11 DIAGNOSIS — M6281 Muscle weakness (generalized): Secondary | ICD-10-CM

## 2021-08-11 NOTE — Therapy (Signed)
OUTPATIENT PHYSICAL THERAPY TREATMENT NOTE   Patient Name: Alicia Shepard MRN: [id number] DOB:07-01-1955, 66 y.o., female Today's Date: 08/11/2021  PCP: [facility] REFERRING PROVIDER: [facility]  END OF SESSION:   PT End of Session - 08/11/21 0843     Visit Number 12    Number of Visits 17    Date for PT Re-Evaluation 08/17/21    Progress Note Due on Visit 10    PT Start Time 0845    PT Stop Time 0927    PT Time Calculation (min) 42 min    Activity Tolerance Patient tolerated treatment well;Patient limited by pain    Behavior During Therapy [facility] for tasks assessed/performed             Past Medical History:  Diagnosis Date   Anxiety    Arthritis    Chronic kidney disease    CKD stage 3   COPD (chronic obstructive pulmonary disease) ([place])    Depression    Diabetes mellitus without complication ([place])    Type 2   GERD (gastroesophageal reflux disease)    History of kidney stones    HOH (hard of hearing)    Hypertension    Neuromuscular disorder ([place])    neuropathy feet and hands   Pneumonia    Past Surgical History:  Procedure Laterality Date   BACK SURGERY     HERNIA REPAIR     x2   REVERSE SHOULDER ARTHROPLASTY Right 05/07/2021   Procedure: REVERSE SHOULDER ARTHROPLASTY;  Surgeon: Nicholes Stairs, MD;  Location: [facility];  Service: Orthopedics;  Laterality: Right;   Patient Active Problem List   Diagnosis Date Noted   S/P reverse total shoulder arthroplasty, 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 She is now 6 weeks post-op. She received therapy in the hospital as well as  HH. She reports compliance with HEP provided by each however those mainly focused on wrist and elbow mobility. She has been very limited in  actively initiating use of right shoulder. She has progressed to feeding herself using her right hand; she bathes and dresses herself with the exception of donning a bra. Her grandson has lowered her desk so it is more comfortable to use her computer. She is able to type on a keyboard however using the mouse has been challenging; pt reports significant pain after attempting to be on the computer for a couple hours. 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 PMH: anxiety, arthritis, CKD stage 3, COPD, DM type 2, HOH, HTN, neuromuscular disorder (neuropathy in hands and feet), hx of back surgery and hernia repair x2.   PRECAUTIONS:  Week 4: begin outpatient therapy working on active range of motion; Week 6: begin passive range of motion and active assist.         OBJECTIVE: (objective measures completed at initial evaluation unless otherwise dated)     DIAGNOSTIC FINDINGS:  X-ray -- "Stable chronic proximal right humeral fracture abutting the humeral component of the right shoulder arthroplasty"   PATIENT SURVEYS:  Quick Dash: eval  72.7%,  08/04/21: 20.5% FOTO: eval 38/59,  08/04/21: 54/59.     COGNITION:           Overall cognitive status: Within functional limits for tasks assessed                                  SENSATION: Light touch: Impaired  - RUE more sensitive through C3-T2 dermatomes   POSTURE: Forward head, rounded shoulders, mild kyphosis in supine    UPPER EXTREMITY ROM:    Active ROM Right 06/23/2021 Left 06/23/2021 Right 08/04/21  Shoulder flexion 68   52 (85 supine)  Shoulder extension        Shoulder abduction 50   52   Shoulder adduction        Shoulder internal rotation 45   WFL  Shoulder external rotation 0   45  Elbow flexion WFL      Elbow extension WFL      Wrist flexion WFL       Wrist extension WFL      Wrist ulnar deviation WFL      Wrist radial deviation WFL      Wrist pronation WFL      Wrist supination WFL      (Blank rows = not tested)   UPPER EXTREMITY MMT:   MMT Right 06/23/2021 Left 06/23/2021 Right 08/04/21  Shoulder flexion NT 4 2- (3+ resisted test)  Shoulder extension NT  4+    Shoulder abduction NT 4 2- (3+ resisted test)  Shoulder adduction NT      Shoulder internal rotation NT 4+ 4+  Shoulder external rotation NT 4- 4-  Middle trapezius NT      Lower trapezius NT      Elbow flexion NT 5 4+  Elbow extension NT 5 4  Wrist flexion        Wrist extension        Wrist ulnar deviation        Wrist radial deviation        Wrist pronation        Wrist supination        Grip strength (lbs)        (Blank rows = not tested)     JOINT MOBILITY TESTING:  NT - significant sensitivity to palpation   PALPATION:  Sensitive to right anterior, lateral and posterior shoulder, lateral upper arm, forearm, wrist, hand.  Most significant sensitivity to shoulder and upper arm.                  TREATMENT TODAY   SUBJECTIVE: Patient reports using 1/2 of her hydrocodone versus taking whole pill. She reports initiating crocheting and tolrated this well, but she did have fleeting paresthesias affecting her R digits that did pass. Pt denies notable pain at arrival - some mild "tightness" along R proximal arm.    PAIN:  Are you having pain? Pt denies pain at arrival.  Pain location: R upper trapezius region, R proximal arm       TODAY'S TREATMENT:     Manual Therapy   Gentle oscillations for pain relief, RUE, x2 minutes Grades II and III joint mobs to right GH joint, inferior mobs and posterior mobs; performed for pain relief and relaxation; 3x30 seconds each  Gentle PROM within patient tolerance for flexion, abduction, IR,  ER     *not today* STM/DTM R upper trapezius General cervical spine distraction  in supine; x10 second bouts, x 10  repetitions Passive R upper trapezius stretching; 2x30sec PROM in supine - right shoulder into flexion, abduction, IR, ER and horizontal adduction; x5 minutes     Therapeutic Exercises   Upper body ergometer,4 minutes forward only (slow cadence) - for R upper limb AAROM and soft tissue warm-up to improve muscle performance, improved soft tissue mobility/extensibility - subjective information gathered during this time   Standing shoulder flexion AAROM with dowel; 2x6    Following performed in supine, RUE: Shoulder flexion AROM in supine; 2x10 - verbal cueing to reduce shoulder hiking, pt utilizing opposite upper limb to limit R shoulder hiking   Seated external rotation AROM bilaterally with scap retraction; 2x10   Rhythmic stabilization at 90 deg forward elevation; 2x30 sec, light multi-directional perturbations AAROM shoulder ER using dowel, 2x10 Wall ladder into shoulder complex flexion, x1 minutes     *next visit* Standing Row with Tband, Red band; 2x10 with tactile cueing for scapular depression      *not today* MHP (unbilled) utilized post-treatment for analgesic effect and improved soft tissue extensibility, draped along R shoulder in sitting with pillow under patient's elbow, x 5 minutes.          *not today* 2-way deltoid isometrics (shoulder flexion, ABD); x10, 5 sec AAROM chest press using dowel, 2x10 AAROM shoulder flexion using dowel, 2x10 AAROM shoulder abduction using dowel, 2x10 Pulleys, AAROM GH flexion, x2 minutes  Pulleys, AAROM GH abduction, x2 minutes Seated scapular retraction, 2x10 Seated bicep curls 1# DB, 2x10 SA row using YellowTB, RUE; 2x10       PATIENT EDUCATION: Education details: HEP review; discussed role of PT and expectations for ROM/return of function following rTSA Person educated: Patient and Child(ren) Education method: Customer service manager Education comprehension: verbalized understanding and returned demonstration      HOME EXERCISE PROGRAM: Access Code: [email or web address]       ASSESSMENT:   CLINICAL IMPRESSION: Patient has improved pain control today with half-dose of her hydrocodone. She demonstrates gradually improving shoulder elevation AROM, but she needs continued work on re-gaining AROM (full overhead mobility not expected with rTSA). Pt participates well with physical therapy and is seeing gradual improvements in ability to perform self-care and reaching activities in her home. Pt has remaining deficits in shoulder AROM, deltoid and periscapular strength, intermittent shoulder and proximal arm pain, and postural changes.  Pt will benefit from PT to improve the noted deficits as needed for upper limb functional use to allow return to work and improved QoL.          OBJECTIVE IMPAIRMENTS decreased ROM, decreased strength, hypomobility, impaired UE functional use, postural dysfunction, and pain.    ACTIVITY LIMITATIONS cleaning, community activity, driving, meal prep, occupation, and laundry.    PERSONAL FACTORS Age, Behavior pattern, Fitness, Time since onset of injury/illness/exacerbation, and 3+ comorbidities: anxiety, arthritis, CKD stage 3, COPD, DM type 2, HOH, HTN, neuromuscular disorder (neuropathy in hands and feet), hx of back surgery and hernia repair x2  are also affecting patient's functional outcome.      REHAB POTENTIAL: Good   CLINICAL DECISION MAKING: Evolving/moderate complexity   EVALUATION COMPLEXITY: Moderate     GOALS: Goals reviewed with patient? Yes   SHORT TERM GOALS: Target date: 07/21/2021     Patient will be independent in home exercise program to improve strength/mobility for better functional independence with ADLs. Baseline: 08/04/21: Pt is compliant with HEP, pt is performing AROM exercises at lower volume due to fatigability of  R arm.  Goal status: IN PROGRESS       LONG TERM GOALS: Target date: 7//4/23     Patient will increase FOTO score to equal to or greater  than 59  to demonstrate statistically significant improvement in mobility and quality of life.  Baseline: 5/9 38.   6/21 54 Goal status: IN PROGRESS    2.  Pt will increase right shoulder flexion and abduction to >140 degrees for improved ability to perform overhead activities. Baseline:  5/9 flexion 68d, abduction 50d.     6/21 flexion 52d, abduction 52d Goal status: NOT MET   3.  Pt will increase strength of global shoulder to at least 4/5 in order to demonstrate improvement in strength and function Baseline: 5/9: NT at eval.  6/21:  R shoulder: 2- for flexion/ABD, 4- ER, 4+ IR Goal status: IN PROGRESS   4.  Pt will decrease quick DASH score by at least 8% in order to demonstrate clinically significant reduction in disability. Baseline: 5/9 72.7%.  6/21: 20.5% Goal status: ACHIEVED     PLAN: PT FREQUENCY: 2x/week   PT DURATION: 4-6 weeks   PLANNED INTERVENTIONS: Therapeutic exercises, Therapeutic activity, Neuromuscular re-education, Balance training, Gait training, Patient/Family education, Joint mobilization, and Dry Needling   PLAN FOR NEXT SESSION: Continue AAROM/AROM and progress with strengthening of right shoulder as pt is able; strengthen periscapular musculature, elbow, wrist and hand. Perform gentle GH mobs and oscillations for pain relief as needed.     Valentina Gu, PT, DPT #[id number]  Eilleen Kempf, PT 08/11/2021, 8:44 AM

## 2021-08-16 ENCOUNTER — Encounter: Payer: Medicare HMO | Admitting: Physical Therapy

## 2021-08-18 ENCOUNTER — Ambulatory Visit: Payer: Medicare HMO | Attending: Orthopedic Surgery | Admitting: Physical Therapy

## 2021-08-18 ENCOUNTER — Encounter: Payer: Self-pay | Admitting: Physical Therapy

## 2021-08-18 DIAGNOSIS — M6281 Muscle weakness (generalized): Secondary | ICD-10-CM | POA: Insufficient documentation

## 2021-08-18 DIAGNOSIS — M25511 Pain in right shoulder: Secondary | ICD-10-CM | POA: Diagnosis present

## 2021-08-18 NOTE — Therapy (Signed)
OUTPATIENT PHYSICAL THERAPY TREATMENT NOTE   Patient Name: Alicia Shepard MRN: [id number] DOB:[birth year], 66 y.o., female Today's Date: 08/18/2021  PCP: [place], [place] REFERRING PROVIDER: Nicholes Stairs, MD  END OF SESSION:   PT End of Session - 08/18/21 0920     Visit Number 13    Number of Visits 17    Date for PT Re-Evaluation 08/17/21    Progress Note Due on Visit 10    PT Start Time 0852    PT Stop Time 0936    PT Time Calculation (min) 44 min    Activity Tolerance Patient tolerated treatment well;Patient limited by pain    Behavior During Therapy [facility] for tasks assessed/performed             Past Medical History:  Diagnosis Date   Anxiety    Arthritis    Chronic kidney disease    CKD stage 3   COPD (chronic obstructive pulmonary disease) ([place])    Depression    Diabetes mellitus without complication ([place])    Type 2   GERD (gastroesophageal reflux disease)    History of kidney stones    HOH (hard of hearing)    Hypertension    Neuromuscular disorder ([place])    neuropathy feet and hands   Pneumonia    Past Surgical History:  Procedure Laterality Date   BACK SURGERY     HERNIA REPAIR     x2   REVERSE SHOULDER ARTHROPLASTY Right 05/07/2021   Procedure: REVERSE SHOULDER ARTHROPLASTY;  Surgeon: Nicholes Stairs, MD;  Location: [facility];  Service: Orthopedics;  Laterality: Right;   Patient Active Problem List   Diagnosis Date Noted   S/P reverse total shoulder arthroplasty, 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 She is now 6 weeks post-op. She received therapy in the hospital as  well as HH. She reports compliance with HEP provided by each however those mainly focused on wrist and elbow mobility. She has been very limited in  actively initiating use of right shoulder. She has progressed to feeding herself using her right hand; she bathes and dresses herself with the exception of donning a bra. Her grandson has lowered her desk so it is more comfortable to use her computer. She is able to type on a keyboard however using the mouse has been challenging; pt reports significant pain after attempting to be on the computer for a couple hours. 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 PMH: anxiety, arthritis, CKD stage 3, COPD, DM type 2, HOH, HTN, neuromuscular disorder (neuropathy in hands and feet), hx of back surgery and hernia repair x2.   PRECAUTIONS:  Week 4: begin outpatient therapy working on active range of motion; Week 6: begin passive range of motion and active assist.         OBJECTIVE: (objective measures completed at initial evaluation unless otherwise dated)     DIAGNOSTIC FINDINGS:  X-ray -- "Stable chronic proximal right humeral fracture abutting the humeral component of the right shoulder arthroplasty"   PATIENT SURVEYS:  Quick Dash: eval  72.7%,  08/04/21: 20.5% FOTO: eval 38/59,  08/04/21: 54/59.     COGNITION:           Overall cognitive status: Within functional limits for tasks assessed                                  SENSATION: Light touch: Impaired  - RUE more sensitive through C3-T2 dermatomes   POSTURE: Forward head, rounded shoulders, mild kyphosis in supine    UPPER EXTREMITY ROM:    Active ROM Right 06/23/2021 Left 06/23/2021 Right 08/04/21  Shoulder flexion 68   52 (85 supine)  Shoulder extension        Shoulder abduction 50   52   Shoulder adduction        Shoulder internal rotation 45   WFL  Shoulder external rotation 0   45  Elbow flexion WFL      Elbow extension WFL      Wrist flexion  WFL      Wrist extension WFL      Wrist ulnar deviation WFL      Wrist radial deviation WFL      Wrist pronation WFL      Wrist supination WFL      (Blank rows = not tested)   UPPER EXTREMITY MMT:   MMT Right 06/23/2021 Left 06/23/2021 Right 08/04/21  Shoulder flexion NT 4 2- (3+ resisted test)  Shoulder extension NT  4+    Shoulder abduction NT 4 2- (3+ resisted test)  Shoulder adduction NT      Shoulder internal rotation NT 4+ 4+  Shoulder external rotation NT 4- 4-  Middle trapezius NT      Lower trapezius NT      Elbow flexion NT 5 4+  Elbow extension NT 5 4  Wrist flexion        Wrist extension        Wrist ulnar deviation        Wrist radial deviation        Wrist pronation        Wrist supination        Grip strength (lbs)        (Blank rows = not tested)     JOINT MOBILITY TESTING:  NT - significant sensitivity to palpation   PALPATION:  Sensitive to right anterior, lateral and posterior shoulder, lateral upper arm, forearm, wrist, hand.  Most significant sensitivity to shoulder and upper arm.                  TREATMENT TODAY   SUBJECTIVE: Patient reports that pain with dressing with her R shoulder has improved. Patient reports "not really" having pain at arrival. Pt reports tolerating last session well. She reports being able to reach to complete desk work for her occupation better (e.g. reaching button to complete fingerprint scan).    PAIN:  Are you having pain? Minimal pain at arrival this AM Pain location: R upper trapezius region, R proximal arm       TODAY'S TREATMENT:     Manual Therapy   Gentle oscillations for pain relief, RUE, x2 minutes Grades II and III joint mobs to right GH joint, inferior mobs and posterior mobs; performed for pain relief and relaxation; 3x30 seconds each  Gentle PROM within patient tolerance for flexion, abduction, IR,  ER     *not today* STM/DTM R upper trapezius General cervical spine  distraction in supine; x10  second bouts, x 10 repetitions Passive R upper trapezius stretching; 2x30sec PROM in supine - right shoulder into flexion, abduction, IR, ER and horizontal adduction; x5 minutes     Therapeutic Exercises   Upper body ergometer,4 minutes forward only (slow cadence) - for R upper limb AAROM and soft tissue warm-up to improve muscle performance, improved soft tissue mobility/extensibility - subjective information gathered during this time  Shoulder flexion AROM in supine; 2x10 - verbal cueing to reduce shoulder hiking, pt utilizing opposite upper limb to limit R shoulder hiking  Supine shoulder ER AAROM with dowel; x20   Standing shoulder flexion AAROM with dowel; 2x10   Standing Row with Tband, Green TBand; 2x10 with tactile cueing for scapular depression and avoidance of shoulder hyperextension     *next visit* Standing external rotation AROM bilaterally with scap retraction; 2x10           *not today* MHP (unbilled) utilized post-treatment for analgesic effect and improved soft tissue extensibility, draped along R shoulder in sitting with pillow under patient's elbow, x 5 minutes.          *not today* Rhythmic stabilization at 90 deg forward elevation; 2x30 sec, light multi-directional perturbations AAROM shoulder ER using dowel, 2x10 Wall ladder into shoulder complex flexion, x1 minutes   2-way deltoid isometrics (shoulder flexion, ABD); x10, 5 sec AAROM chest press using dowel, 2x10 AAROM shoulder flexion using dowel, 2x10 AAROM shoulder abduction using dowel, 2x10 Pulleys, AAROM GH flexion, x2 minutes  Pulleys, AAROM GH abduction, x2 minutes Seated scapular retraction, 2x10 Seated bicep curls 1# DB, 2x10 SA row using YellowTB, RUE; 2x10       PATIENT EDUCATION: Education details: HEP review; discussed role of PT and expectations for ROM/return of function following rTSA Person educated: Patient and Child(ren) Education method: Holiday representative Education comprehension: verbalized understanding and returned demonstration     HOME EXERCISE PROGRAM: Access Code: [email or web address]       ASSESSMENT:   CLINICAL IMPRESSION: Patient demonstrates improving ability to access shoulder forward elevation AAROM and AROM in standing. She does have ongoing compensatory scapular elevation with active shoulder flexion requiring tactile cueing to correct. Patient is still notably limited with functional reaching and self-care ADLs. Pt has remaining deficits in shoulder AROM, deltoid and periscapular strength, intermittent shoulder and proximal arm pain, and postural changes.  Pt will benefit from PT to improve the noted deficits as needed for upper limb functional use to allow return to work and improved QoL.          OBJECTIVE IMPAIRMENTS decreased ROM, decreased strength, hypomobility, impaired UE functional use, postural dysfunction, and pain.    ACTIVITY LIMITATIONS cleaning, community activity, driving, meal prep, occupation, and laundry.    PERSONAL FACTORS Age, Behavior pattern, Fitness, Time since onset of injury/illness/exacerbation, and 3+ comorbidities: anxiety, arthritis, CKD stage 3, COPD, DM type 2, HOH, HTN, neuromuscular disorder (neuropathy in hands and feet), hx of back surgery and hernia repair x2  are also affecting patient's functional outcome.      REHAB POTENTIAL: Good   CLINICAL DECISION MAKING: Evolving/moderate complexity   EVALUATION COMPLEXITY: Moderate     GOALS: Goals reviewed with patient? Yes   SHORT TERM GOALS: Target date: 07/21/2021     Patient will be independent in home exercise program to improve strength/mobility for better functional independence with ADLs. Baseline: 08/04/21: Pt is compliant with HEP, pt is performing AROM exercises at lower volume due to fatigability of R arm.  Goal status: IN PROGRESS       LONG TERM GOALS: Target date: 7//4/23     Patient will increase FOTO score to  equal to or greater than 59  to demonstrate statistically significant improvement in mobility and quality of life.  Baseline: 5/9 38.   6/21 54 Goal status: IN PROGRESS    2.  Pt will increase right shoulder flexion and abduction to >140 degrees for improved ability to perform overhead activities. Baseline:  5/9 flexion 68d, abduction 50d.     6/21 flexion 52d, abduction 52d Goal status: NOT MET   3.  Pt will increase strength of global shoulder to at least 4/5 in order to demonstrate improvement in strength and function Baseline: 5/9: NT at eval.  6/21:  R shoulder: 2- for flexion/ABD, 4- ER, 4+ IR Goal status: IN PROGRESS   4.  Pt will decrease quick DASH score by at least 8% in order to demonstrate clinically significant reduction in disability. Baseline: 5/9 72.7%.  6/21: 20.5% Goal status: ACHIEVED     PLAN: PT FREQUENCY: 2x/week   PT DURATION: 4-6 weeks   PLANNED INTERVENTIONS: Therapeutic exercises, Therapeutic activity, Neuromuscular re-education, Balance training, Gait training, Patient/Family education, Joint mobilization, and Dry Needling   PLAN FOR NEXT SESSION: Continue AAROM/AROM and progress with strengthening of right shoulder as pt is able; strengthen periscapular musculature, elbow, wrist and hand. Perform gentle GH mobs and oscillations for pain relief as needed.       Valentina Gu, PT, DPT #[id number]  Eilleen Kempf, PT 08/18/2021, 12:49 PM

## 2021-08-23 ENCOUNTER — Encounter: Payer: Self-pay | Admitting: Physical Therapy

## 2021-08-23 ENCOUNTER — Ambulatory Visit: Payer: Medicare HMO | Admitting: Physical Therapy

## 2021-08-23 DIAGNOSIS — M6281 Muscle weakness (generalized): Secondary | ICD-10-CM | POA: Diagnosis not present

## 2021-08-23 DIAGNOSIS — M25511 Pain in right shoulder: Secondary | ICD-10-CM

## 2021-08-23 NOTE — Therapy (Signed)
OUTPATIENT PHYSICAL THERAPY TREATMENT NOTE   Patient Name: Misako Roeder MRN: [id number] DOB:12-19-1955, 66 y.o., female Today's Date: 08/23/2021  PCP: [place], [place] REFERRING PROVIDER: Nicholes Stairs, MD  END OF SESSION:   PT End of Session - 08/23/21 0758     Visit Number 14    Number of Visits 17    Date for PT Re-Evaluation 08/17/21    Progress Note Due on Visit 10    PT Start Time 5638    PT Stop Time 0843    PT Time Calculation (min) 44 min    Activity Tolerance Patient tolerated treatment well;Patient limited by pain    Behavior During Therapy [facility] for tasks assessed/performed             Past Medical History:  Diagnosis Date   Anxiety    Arthritis    Chronic kidney disease    CKD stage 3   COPD (chronic obstructive pulmonary disease) ([place])    Depression    Diabetes mellitus without complication ([place])    Type 2   GERD (gastroesophageal reflux disease)    History of kidney stones    HOH (hard of hearing)    Hypertension    Neuromuscular disorder ([place])    neuropathy feet and hands   Pneumonia    Past Surgical History:  Procedure Laterality Date   BACK SURGERY     HERNIA REPAIR     x2   REVERSE SHOULDER ARTHROPLASTY Right 05/07/2021   Procedure: REVERSE SHOULDER ARTHROPLASTY;  Surgeon: Nicholes Stairs, MD;  Location: [facility];  Service: Orthopedics;  Laterality: Right;   Patient Active Problem List   Diagnosis Date Noted   S/P reverse total shoulder arthroplasty, 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 She is now 6 weeks post-op. She received therapy in the hospital as  well as HH. She reports compliance with HEP provided by each however those mainly focused on wrist and elbow mobility. She has been very limited in  actively initiating use of right shoulder. She has progressed to feeding herself using her right hand; she bathes and dresses herself with the exception of donning a bra. Her grandson has lowered her desk so it is more comfortable to use her computer. She is able to type on a keyboard however using the mouse has been challenging; pt reports significant pain after attempting to be on the computer for a couple hours. 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 PMH: anxiety, arthritis, CKD stage 3, COPD, DM type 2, HOH, HTN, neuromuscular disorder (neuropathy in hands and feet), hx of back surgery and hernia repair x2.   PRECAUTIONS:  Week 4: begin outpatient therapy working on active range of motion; Week 6: begin passive range of motion and active assist.         OBJECTIVE: (objective measures completed at initial evaluation unless otherwise dated)     DIAGNOSTIC FINDINGS:  X-ray -- "Stable chronic proximal right humeral fracture abutting the humeral component of the right shoulder arthroplasty"   PATIENT SURVEYS:  Quick Dash: eval  72.7%,  08/04/21: 20.5% FOTO: eval 38/59,  08/04/21: 54/59.     COGNITION:           Overall cognitive status: Within functional limits for tasks assessed                                  SENSATION: Light touch: Impaired  - RUE more sensitive through C3-T2 dermatomes   POSTURE: Forward head, rounded shoulders, mild kyphosis in supine    UPPER EXTREMITY ROM:    Active ROM Right 06/23/2021 Left 06/23/2021 Right 08/04/21  Shoulder flexion 68   52 (85 supine)  Shoulder extension        Shoulder abduction 50   52   Shoulder adduction        Shoulder internal rotation 45   WFL  Shoulder external rotation 0   45  Elbow flexion WFL      Elbow extension WFL      Wrist flexion  WFL      Wrist extension WFL      Wrist ulnar deviation WFL      Wrist radial deviation WFL      Wrist pronation WFL      Wrist supination WFL      (Blank rows = not tested)   UPPER EXTREMITY MMT:   MMT Right 06/23/2021 Left 06/23/2021 Right 08/04/21  Shoulder flexion NT 4 2- (3+ resisted test)  Shoulder extension NT  4+    Shoulder abduction NT 4 2- (3+ resisted test)  Shoulder adduction NT      Shoulder internal rotation NT 4+ 4+  Shoulder external rotation NT 4- 4-  Middle trapezius NT      Lower trapezius NT      Elbow flexion NT 5 4+  Elbow extension NT 5 4  Wrist flexion        Wrist extension        Wrist ulnar deviation        Wrist radial deviation        Wrist pronation        Wrist supination        Grip strength (lbs)        (Blank rows = not tested)     JOINT MOBILITY TESTING:  NT - significant sensitivity to palpation   PALPATION:  Sensitive to right anterior, lateral and posterior shoulder, lateral upper arm, forearm, wrist, hand.  Most significant sensitivity to shoulder and upper arm.                  TREATMENT TODAY   SUBJECTIVE: Patient reports that her R arm is sore along middle deltoid and lateral arm. She reports episode of coughing and SOB over the weekend and being unable to move when this happened. Pt was eventually able to get to her albuterol, but she felt this didn't help a lot at the time - it did eventually improve. She states she didn't seek out EMS only because she couldn't get to the phone. Patient is following up with her physician today regarding this flare-up.    PAIN:  Are you having pain? Moderate soreness at arrival; pt does not report pain Pain location: R middle deltoid/lateral arm       TODAY'S TREATMENT:     Manual Therapy   Gentle oscillations for pain relief, RUE, x1 minute Grades II and III joint mobs to right GH joint, inferior mobs and  posterior mobs; performed for pain relief and relaxation; 2x30 seconds each   Gentle PROM within patient tolerance for flexion, abduction, IR,  ER     *not today* STM/DTM R upper trapezius General cervical spine distraction in supine; x10 second bouts, x 10 repetitions Passive R upper trapezius stretching; 2x30sec PROM in supine - right shoulder into flexion, abduction, IR, ER and horizontal adduction; x5 minutes     Therapeutic Exercises   Upper body ergometer,4 minutes forward only (slow cadence) - for R upper limb AAROM and soft tissue warm-up to improve muscle performance, improved soft tissue mobility/extensibility - subjective information gathered during this time   Shoulder flexion AROM in supine; 2x10 - verbal and tactile cueing to reduce shoulder hiking   Standing external rotation AROM bilaterally with scap retraction; 2x10    Standing shoulder flexion AAROM with dowel; 2x10   Standing Row with Tband, Green TBand; 2x10 with tactile cueing for scapular depression and avoidance of shoulder hyperextension  Swiss ball roll across tabletop; flexion, single-arm (RUE); x20    *not today* Supine shoulder ER AAROM with dowel; x20      *not today* MHP (unbilled) utilized post-treatment for analgesic effect and improved soft tissue extensibility, draped along R shoulder in sitting with pillow under patient's elbow, x 5 minutes.          *not today* Rhythmic stabilization at 90 deg forward elevation; 2x30 sec, light multi-directional perturbations AAROM shoulder ER using dowel, 2x10 Wall ladder into shoulder complex flexion, x1 minutes   2-way deltoid isometrics (shoulder flexion, ABD); x10, 5 sec AAROM chest press using dowel, 2x10 AAROM shoulder flexion using dowel, 2x10 AAROM shoulder abduction using dowel, 2x10 Pulleys, AAROM GH flexion, x2 minutes  Pulleys, AAROM GH abduction, x2 minutes Seated scapular retraction, 2x10 Seated bicep curls 1# DB, 2x10 SA row using YellowTB, RUE; 2x10       PATIENT EDUCATION: Education details: HEP  review; discussed role of PT and expectations for ROM/return of function following rTSA Person educated: Patient and Child(ren) Education method: Customer service manager Education comprehension: verbalized understanding and returned demonstration     HOME EXERCISE PROGRAM: Access Code: [email or web address]       ASSESSMENT:   CLINICAL IMPRESSION: Patient has remaining shoulder AROM deficits and is unable to access full available R shoulder elevation range in upright position. Pt has marked weakness and full overhead motion is not expected given her rTSA. Pt needs further work on active motion and restoration of strength as needed for reaching and household lifting tasks. Pt has remaining deficits in shoulder AROM, deltoid and periscapular strength, intermittent shoulder and proximal arm pain, and postural changes.  Pt will benefit from PT to improve the noted deficits as needed for upper limb functional use to allow return to work and improved QoL.          OBJECTIVE IMPAIRMENTS decreased ROM, decreased strength, hypomobility, impaired UE functional use, postural dysfunction, and pain.    ACTIVITY LIMITATIONS cleaning, community activity, driving, meal prep, occupation, and laundry.    PERSONAL FACTORS Age, Behavior pattern, Fitness, Time since onset of injury/illness/exacerbation, and 3+ comorbidities: anxiety, arthritis, CKD stage 3, COPD, DM type 2, HOH, HTN, neuromuscular disorder (neuropathy in hands and feet), hx of back surgery and hernia repair x2  are also affecting patient's functional outcome.      REHAB POTENTIAL: Good   CLINICAL DECISION MAKING: Evolving/moderate complexity   EVALUATION COMPLEXITY: Moderate     GOALS: Goals reviewed with patient? Yes   SHORT  TERM GOALS: Target date: 07/21/2021     Patient will be independent in home exercise program to improve strength/mobility for better functional independence with ADLs. Baseline: 08/04/21: Pt is compliant with HEP, pt is  performing AROM exercises at lower volume due to fatigability of R arm.  Goal status: IN PROGRESS       LONG TERM GOALS: Target date: 7//4/23     Patient will increase FOTO score to equal to or greater than 59  to demonstrate statistically significant improvement in mobility and quality of life.  Baseline: 5/9 38.   6/21 54 Goal status: IN PROGRESS    2.  Pt will increase right shoulder flexion and abduction to >140 degrees for improved ability to perform overhead activities. Baseline:  5/9 flexion 68d, abduction 50d.     6/21 flexion 52d, abduction 52d Goal status: NOT MET   3.  Pt will increase strength of global shoulder to at least 4/5 in order to demonstrate improvement in strength and function Baseline: 5/9: NT at eval.  6/21:  R shoulder: 2- for flexion/ABD, 4- ER, 4+ IR Goal status: IN PROGRESS   4.  Pt will decrease quick DASH score by at least 8% in order to demonstrate clinically significant reduction in disability. Baseline: 5/9 72.7%.  6/21: 20.5% Goal status: ACHIEVED     PLAN: PT FREQUENCY: 2x/week   PT DURATION: 4-6 weeks   PLANNED INTERVENTIONS: Therapeutic exercises, Therapeutic activity, Neuromuscular re-education, Balance training, Gait training, Patient/Family education, Joint mobilization, and Dry Needling   PLAN FOR NEXT SESSION: Continue AAROM/AROM and progress with strengthening of right shoulder as pt is able; strengthen periscapular musculature, elbow, wrist and hand. Perform gentle GH mobs and oscillations for pain relief as needed.      Valentina Gu, PT, DPT #[id number]  Eilleen Kempf, PT 08/23/2021, 7:59 AM

## 2021-08-26 ENCOUNTER — Ambulatory Visit: Payer: Medicare HMO | Admitting: Physical Therapy

## 2021-08-30 ENCOUNTER — Ambulatory Visit: Payer: Medicare HMO | Admitting: Physical Therapy

## 2021-09-01 ENCOUNTER — Encounter: Payer: Self-pay | Admitting: Physical Therapy

## 2021-09-01 ENCOUNTER — Ambulatory Visit: Payer: Medicare HMO | Admitting: Physical Therapy

## 2021-09-01 DIAGNOSIS — M6281 Muscle weakness (generalized): Secondary | ICD-10-CM | POA: Diagnosis not present

## 2021-09-01 DIAGNOSIS — M25511 Pain in right shoulder: Secondary | ICD-10-CM

## 2021-09-01 NOTE — Therapy (Signed)
OUTPATIENT PHYSICAL THERAPY TREATMENT NOTE   Patient Name: Alicia Shepard MRN: [id number] DOB:1955/06/13, 66 y.o., female Today's Date: 09/02/2021  PCP: [place] REFERRING PROVIDER: Nicholes Stairs, MD  END OF SESSION:   PT End of Session - 09/02/21 1443     Visit Number 15    Number of Visits 17    Date for PT Re-Evaluation 09/16/21    Progress Note Due on Visit 10    PT Start Time 0845    PT Stop Time 0929    PT Time Calculation (min) 44 min    Activity Tolerance Patient tolerated treatment well;Patient limited by pain    Behavior During Therapy [facility] for tasks assessed/performed             Past Medical History:  Diagnosis Date   Anxiety    Arthritis    Chronic kidney disease    CKD stage 3   COPD (chronic obstructive pulmonary disease) ([place])    Depression    Diabetes mellitus without complication ([place])    Type 2   GERD (gastroesophageal reflux disease)    History of kidney stones    HOH (hard of hearing)    Hypertension    Neuromuscular disorder ([place])    neuropathy feet and hands   Pneumonia    Past Surgical History:  Procedure Laterality Date   BACK SURGERY     HERNIA REPAIR     x2   REVERSE SHOULDER ARTHROPLASTY Right 05/07/2021   Procedure: REVERSE SHOULDER ARTHROPLASTY;  Surgeon: Nicholes Stairs, MD;  Location: [facility];  Service: Orthopedics;  Laterality: Right;   Patient Active Problem List   Diagnosis Date Noted   S/P reverse total shoulder arthroplasty, right 05/07/2021     REFERRING DIAG: Z47.89 (ICD-10-CM) - Encounter for other orthopedic aftercare Right reverse total shoulder arthroplasty (05/07/21)   THERAPY DIAG:  Muscle weakness (generalized)   Acute pain of right shoulder   PERTINENT HISTORY: Pt is a 65 y/o female s/p right reverse TSA on 05/07/21 after enduring a fall that resulted in a fracture to the right proximal humerus (fall and fx > 6 months ago). She is now 6 weeks post-op. She received therapy in the hospital as  well as HH. She reports compliance with HEP provided by each however those mainly focused on wrist and elbow mobility. She has been very limited in  actively initiating use of right shoulder. She has progressed to feeding herself using her right hand; she bathes and dresses herself with the exception of donning a bra. Her grandson has lowered her desk so it is more comfortable to use her computer. She is able to type on a keyboard however using the mouse has been challenging; pt reports significant pain after attempting to be on the computer for a couple hours. 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 PMH: anxiety, arthritis, CKD stage 3, COPD, DM type 2, HOH, HTN, neuromuscular disorder (neuropathy in hands and feet), hx of back surgery and hernia repair x2.   PRECAUTIONS:  Week 4: begin outpatient therapy working on active range of motion; Week 6: begin passive range of motion and active assist.         OBJECTIVE: (objective measures completed at initial evaluation unless otherwise dated)     DIAGNOSTIC FINDINGS:  X-ray -- "Stable chronic proximal right humeral fracture abutting the humeral component of the right shoulder arthroplasty"   PATIENT SURVEYS:  Quick Dash: eval  72.7%,  08/04/21: 20.5% FOTO: eval 38/59,  08/04/21: 54/59.     COGNITION:           Overall cognitive status: Within functional limits for tasks assessed                                  SENSATION: Light touch: Impaired  - RUE more sensitive through C3-T2 dermatomes   POSTURE: Forward head, rounded shoulders, mild kyphosis in supine    UPPER EXTREMITY ROM:    Active ROM Right 06/23/2021 Left 06/23/2021 Right 08/04/21  Shoulder flexion 68   52 (85 supine)  Shoulder extension        Shoulder abduction 50   52   Shoulder adduction        Shoulder internal rotation 45   WFL  Shoulder external rotation 0   45  Elbow flexion WFL      Elbow extension WFL      Wrist flexion  WFL      Wrist extension WFL      Wrist ulnar deviation WFL      Wrist radial deviation WFL      Wrist pronation WFL      Wrist supination WFL      (Blank rows = not tested)   UPPER EXTREMITY MMT:   MMT Right 06/23/2021 Left 06/23/2021 Right 08/04/21  Shoulder flexion NT 4 2- (3+ resisted test)  Shoulder extension NT  4+    Shoulder abduction NT 4 2- (3+ resisted test)  Shoulder adduction NT      Shoulder internal rotation NT 4+ 4+  Shoulder external rotation NT 4- 4-  Middle trapezius NT      Lower trapezius NT      Elbow flexion NT 5 4+  Elbow extension NT 5 4  Wrist flexion        Wrist extension        Wrist ulnar deviation        Wrist radial deviation        Wrist pronation        Wrist supination        Grip strength (lbs)        (Blank rows = not tested)     JOINT MOBILITY TESTING:  NT - significant sensitivity to palpation   PALPATION:  Sensitive to right anterior, lateral and posterior shoulder, lateral upper arm, forearm, wrist, hand.  Most significant sensitivity to shoulder and upper arm.                  TREATMENT TODAY   SUBJECTIVE: Patient reports feeling well at arrival. She reports partial compliance with HEP. Patient reports change in her work schedule and time she has for a break that has changed her routine. Birthday of her husband who passed 2 years ago was just this past week and patient is having difficulty with grieving and reports worsening depression.    PAIN:  Are you having pain? Moderate soreness at arrival; pt does not report pain Pain location: R middle deltoid/lateral arm       TODAY'S TREATMENT:     Manual Therapy   Gentle oscillations for pain relief, RUE, x1 minute Grades II and III joint mobs to right GH joint, inferior mobs and posterior mobs; performed for pain relief and relaxation; 2x30 seconds each  Gentle PROM within patient tolerance for flexion, abduction, IR,  ER     *  not today* STM/DTM R upper  trapezius General cervical spine distraction in supine; x10 second bouts, x 10 repetitions Passive R upper trapezius stretching; 2x30sec PROM in supine - right shoulder into flexion, abduction, IR, ER and horizontal adduction; x5 minutes       Therapeutic Exercises   Upper body ergometer, 2 minutes forward, 2 minutes backward - for R upper limb AAROM and soft tissue warm-up to improve muscle performance, improved soft tissue mobility/extensibility - subjective information gathered during this time   Shoulder dowel flexion AAROM in supine; x15  Shoulder flexion AROM in supine; 2x10 - verbal and tactile cueing to reduce shoulder hiking    Yellow physioball roll up wall; x10 for active shoulder flexion      Finger ladder for active shoulder elevation; x 3 minutes with 5 sec at top of patient's available shoulder elevation    Standing Row with Tband, Green TBand; 2x10 with tactile cueing for scapular depression and avoidance of shoulder hyperextension         *not today* Swiss ball roll across tabletop; flexion, single-arm (RUE); x20 Standing external rotation AROM bilaterally with scap retraction; 2x10  Standing shoulder flexion AAROM with dowel; 2x10 Supine shoulder ER AAROM with dowel; x20      *not today* MHP (unbilled) utilized post-treatment for analgesic effect and improved soft tissue extensibility, draped along R shoulder in sitting with pillow under patient's elbow, x 5 minutes.          *not today* Rhythmic stabilization at 90 deg forward elevation; 2x30 sec, light multi-directional perturbations AAROM shoulder ER using dowel, 2x10 Wall ladder into shoulder complex flexion, x1 minutes   2-way deltoid isometrics (shoulder flexion, ABD); x10, 5 sec AAROM chest press using dowel, 2x10 AAROM shoulder flexion using dowel, 2x10 AAROM shoulder abduction using dowel, 2x10 Pulleys, AAROM GH flexion, x2 minutes  Pulleys, AAROM GH abduction, x2 minutes Seated scapular  retraction, 2x10 Seated bicep curls 1# DB, 2x10 SA row using YellowTB, RUE; 2x10       PATIENT EDUCATION: Education details: see above for patient education details  Person educated: Patient and Child(ren) Education method: Customer service manager Education comprehension: verbalized understanding and returned demonstration     HOME EXERCISE PROGRAM: Access Code: [email or web address]       ASSESSMENT:   CLINICAL IMPRESSION: Patient has notable contextual/psychosocial factors contributing to her current condition and limited HEP compliance. Pt reports worsening depression following anniversary of her husband's passing. Discussed at length psychosocial factors contributing to her condition and recommended workup with counseling and behavioral health given her current symptoms. Pt demonstrates improving ability to access her ROM actively and will benefit from gradual progression of shoulder elevation against gravity with use of lawn chair progression. Pt has remaining deficits in shoulder AROM, deltoid and periscapular strength, intermittent shoulder and proximal arm pain, and postural changes.  Pt will benefit from PT to improve the noted deficits as needed for upper limb functional use to allow return to work and improved QoL.          OBJECTIVE IMPAIRMENTS decreased ROM, decreased strength, hypomobility, impaired UE functional use, postural dysfunction, and pain.    ACTIVITY LIMITATIONS cleaning, community activity, driving, meal prep, occupation, and laundry.    PERSONAL FACTORS Age, Behavior pattern, Fitness, Time since onset of injury/illness/exacerbation, and 3+ comorbidities: anxiety, arthritis, CKD stage 3, COPD, DM type 2, HOH, HTN, neuromuscular disorder (neuropathy in hands and feet), hx of back surgery and hernia repair x2  are also affecting patient's functional outcome.  REHAB POTENTIAL: Good   CLINICAL DECISION MAKING: Evolving/moderate complexity   EVALUATION  COMPLEXITY: Moderate     GOALS: Goals reviewed with patient? Yes   SHORT TERM GOALS: Target date: 07/21/2021     Patient will be independent in home exercise program to improve strength/mobility for better functional independence with ADLs. Baseline: 08/04/21: Pt is compliant with HEP, pt is performing AROM exercises at lower volume due to fatigability of R arm.  Goal status: IN PROGRESS       LONG TERM GOALS: Target date: 7//4/23     Patient will increase FOTO score to equal to or greater than 59  to demonstrate statistically significant improvement in mobility and quality of life.  Baseline: 5/9 38.   6/21 54 Goal status: IN PROGRESS    2.  Pt will increase right shoulder flexion and abduction to >140 degrees for improved ability to perform overhead activities. Baseline:  5/9 flexion 68d, abduction 50d.     6/21 flexion 52d, abduction 52d Goal status: NOT MET   3.  Pt will increase strength of global shoulder to at least 4/5 in order to demonstrate improvement in strength and function Baseline: 5/9: NT at eval.  6/21:  R shoulder: 2- for flexion/ABD, 4- ER, 4+ IR Goal status: IN PROGRESS   4.  Pt will decrease quick DASH score by at least 8% in order to demonstrate clinically significant reduction in disability. Baseline: 5/9 72.7%.  6/21: 20.5% Goal status: ACHIEVED     PLAN: PT FREQUENCY: 2x/week   PT DURATION: 4-6 weeks   PLANNED INTERVENTIONS: Therapeutic exercises, Therapeutic activity, Neuromuscular re-education, Balance training, Gait training, Patient/Family education, Joint mobilization, and Dry Needling   PLAN FOR NEXT SESSION: Continue AAROM/AROM and progress with strengthening of right shoulder as pt is able; strengthen periscapular musculature, elbow, wrist and hand. Perform gentle GH mobs and oscillations for pain relief as needed.     Valentina Gu, PT, DPT #[id number]  Eilleen Kempf, PT 09/02/2021, 2:44 PM

## 2021-09-06 ENCOUNTER — Ambulatory Visit: Payer: Medicare HMO | Admitting: Physical Therapy

## 2021-09-06 ENCOUNTER — Encounter: Payer: Self-pay | Admitting: Physical Therapy

## 2021-09-06 DIAGNOSIS — M6281 Muscle weakness (generalized): Secondary | ICD-10-CM | POA: Diagnosis not present

## 2021-09-06 DIAGNOSIS — M25511 Pain in right shoulder: Secondary | ICD-10-CM

## 2021-09-06 NOTE — Therapy (Signed)
OUTPATIENT PHYSICAL THERAPY TREATMENT NOTE   Patient Name: Alicia Shepard MRN: [id number] DOB:Dec 02, 1955, 66 y.o., female Today's Date: 09/06/2021  PCP: [place] REFERRING PROVIDER: Nicholes Stairs, MD  END OF SESSION:   PT End of Session - 09/06/21 0804     Visit Number 16    Number of Visits 17    Date for PT Re-Evaluation 09/16/21    Progress Note Due on Visit 10    PT Start Time 0800    PT Stop Time 7353    PT Time Calculation (min) 43 min    Activity Tolerance Patient tolerated treatment well;Patient limited by pain    Behavior During Therapy [facility] for tasks assessed/performed             Past Medical History:  Diagnosis Date   Anxiety    Arthritis    Chronic kidney disease    CKD stage 3   COPD (chronic obstructive pulmonary disease) ([place] )    Depression    Diabetes mellitus without complication ([place])    Type 2   GERD (gastroesophageal reflux disease)    History of kidney stones    HOH (hard of hearing)    Hypertension    Neuromuscular disorder ([place])    neuropathy feet and hands   Pneumonia    Past Surgical History:  Procedure Laterality Date   BACK SURGERY     HERNIA REPAIR     x2   REVERSE SHOULDER ARTHROPLASTY Right 05/07/2021   Procedure: REVERSE SHOULDER ARTHROPLASTY;  Surgeon: Nicholes Stairs, MD;  Location: [facility];  Service: Orthopedics;  Laterality: Right;   Patient Active Problem List   Diagnosis Date Noted   S/P reverse total shoulder arthroplasty, 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 She is now 6 weeks post-op. She received therapy in the hospital as  well as HH. She reports compliance with HEP provided by each however those mainly focused on wrist and elbow mobility. She has been very limited in  actively initiating use of right shoulder. She has progressed to feeding herself using her right hand; she bathes and dresses herself with the exception of donning a bra. Her grandson has lowered her desk so it is more comfortable to use her computer. She is able to type on a keyboard however using the mouse has been challenging; pt reports significant pain after attempting to be on the computer for a couple hours. 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 PMH: anxiety, arthritis, CKD stage 3, COPD, DM type 2, HOH, HTN, neuromuscular disorder (neuropathy in hands and feet), hx of back surgery and hernia repair x2.   PRECAUTIONS:  Week 4: begin outpatient therapy working on active range of motion; Week 6: begin passive range of motion and active assist.         OBJECTIVE: (objective measures completed at initial evaluation unless otherwise dated)     DIAGNOSTIC FINDINGS:  X-ray -- "Stable chronic proximal right humeral fracture abutting the humeral component of the right shoulder arthroplasty"   PATIENT SURVEYS:  Quick Dash:  eval 72.7%,  08/04/21: 20.5% FOTO: eval 38/59,  08/04/21: 54/59.     COGNITION:           Overall cognitive status: Within functional limits for tasks assessed                                  SENSATION: Light touch: Impaired  - RUE more sensitive through C3-T2 dermatomes   POSTURE: Forward head, rounded shoulders, mild kyphosis in supine    UPPER EXTREMITY ROM:    Active ROM Right 06/23/2021 Left 06/23/2021 Right 08/04/21  Shoulder flexion 68   52 (85 supine)  Shoulder extension        Shoulder abduction 50   52   Shoulder adduction        Shoulder internal rotation 45   WFL  Shoulder external rotation 0   45  Elbow flexion WFL      Elbow extension WFL      Wrist flexion  WFL      Wrist extension WFL      Wrist ulnar deviation WFL      Wrist radial deviation WFL      Wrist pronation WFL      Wrist supination WFL      (Blank rows = not tested)   UPPER EXTREMITY MMT:   MMT Right 06/23/2021 Left 06/23/2021 Right 08/04/21  Shoulder flexion NT 4 2- (3+ resisted test)  Shoulder extension NT  4+    Shoulder abduction NT 4 2- (3+ resisted test)  Shoulder adduction NT      Shoulder internal rotation NT 4+ 4+  Shoulder external rotation NT 4- 4-  Middle trapezius NT      Lower trapezius NT      Elbow flexion NT 5 4+  Elbow extension NT 5 4  Wrist flexion        Wrist extension        Wrist ulnar deviation        Wrist radial deviation        Wrist pronation        Wrist supination        Grip strength (lbs)        (Blank rows = not tested)     JOINT MOBILITY TESTING:  NT - significant sensitivity to palpation   PALPATION:  Sensitive to right anterior, lateral and posterior shoulder, lateral upper arm, forearm, wrist, hand.  Most significant sensitivity to shoulder and upper arm.                  TREATMENT TODAY   SUBJECTIVE: Patient reports doing better today in regard to mental health issues discussed last time. Patient reports no significant pain at arrival. Patient is compliant with her HEP.  She reports difficulty with using her R arm to reach for plate and eating utensils at restaurant with higher tables.    PAIN:  Are you having pain? Moderate soreness at arrival; pt does not report pain Pain location: R middle deltoid/lateral arm       TODAY'S TREATMENT:     Manual Therapy   *deferred today* Gentle oscillations for pain relief, RUE, x1 minute Grades II and III joint mobs to right GH joint, inferior mobs and posterior mobs; performed for pain relief and relaxation; 2x30 seconds each  Gentle PROM within patient tolerance for flexion, abduction, IR,  ER     *not today* STM/DTM R upper  trapezius General cervical spine  distraction in supine; x10 second bouts, x 10 repetitions Passive R upper trapezius stretching; 2x30sec PROM in supine - right shoulder into flexion, abduction, IR, ER and horizontal adduction; x5 minutes         Therapeutic Exercises   Upper body ergometer, 2 minutes forward, 2 minutes backward - for R upper limb AAROM and soft tissue warm-up to improve muscle performance, improved soft tissue mobility/extensibility - subjective information gathered during this time     Shoulder flexion AROM in reclined position, lying on orange wedge for lawn chair progression; 2x10 -                Yellow physioball roll up wall; x10 for active shoulder flexion     Shoulder extension with single-arm; Red Tband linked to 2nd link from ceiling; 2x10, using eccentric phae for active-assisted shoulder elevation                           Finger ladder for active shoulder elevation; x 2 minutes with 5 sec at top of patient's available shoulder elevation                           Standing Row with Tband, Blue TBand; 2x8 with minimal cueing for scapular depression/retraction    Standing tricep extension with Green Tband; cueing for upright posture and body positioning to attain full elbow extension; 2x10; Grn Tband linked to 6th ring from top       *not today* Shoulder dowel flexion AAROM in supine; x15 Swiss ball roll across tabletop; flexion, single-arm (RUE); x20 Standing external rotation AROM bilaterally with scap retraction; 2x10  Standing shoulder flexion AAROM with dowel; 2x10 Supine shoulder ER AAROM with dowel; x20      *not today* MHP (unbilled) utilized post-treatment for analgesic effect and improved soft tissue extensibility, draped along R shoulder in sitting with pillow under patient's elbow, x 5 minutes.          *not today* Rhythmic stabilization at 90 deg forward elevation; 2x30 sec, light multi-directional perturbations AAROM shoulder ER using dowel, 2x10 Wall ladder into  shoulder complex flexion, x1 minutes   2-way deltoid isometrics (shoulder flexion, ABD); x10, 5 sec AAROM chest press using dowel, 2x10 AAROM shoulder flexion using dowel, 2x10 AAROM shoulder abduction using dowel, 2x10 Pulleys, AAROM GH flexion, x2 minutes  Pulleys, AAROM GH abduction, x2 minutes Seated scapular retraction, 2x10 Seated bicep curls 1# DB, 2x10 SA row using YellowTB, RUE; 2x10       PATIENT EDUCATION: Education details: see above for patient education details   Person educated: Patient and Child(ren) Education method: Customer service manager Education comprehension: verbalized understanding and returned demonstration     HOME EXERCISE PROGRAM: Access Code: [email or web address]       ASSESSMENT:   CLINICAL IMPRESSION: Patient is able to initiate lawn chair progression today with minimal assist from therapist reqiured to attain her available shoulder elevation in reclined position (lying back on wedge). Pt is still significantly limited with active forward elevation against gravity, and there may be relative ceiling effect given Hx of rTSA. Pt is able to reach into her full available range in standing utilizing eccentric phase of single-arm shoulder extension with light theraband.  Pt has remaining deficits in shoulder AROM, deltoid and periscapular strength, intermittent shoulder and proximal arm pain, and postural changes.  Pt will benefit from PT to improve the  noted deficits as needed for upper limb functional use to allow return to work and improved QoL.          OBJECTIVE IMPAIRMENTS decreased ROM, decreased strength, hypomobility, impaired UE functional use, postural dysfunction, and pain.    ACTIVITY LIMITATIONS cleaning, community activity, driving, meal prep, occupation, and laundry.    PERSONAL FACTORS Age, Behavior pattern, Fitness, Time since onset of injury/illness/exacerbation, and 3+ comorbidities: anxiety, arthritis, CKD stage 3, COPD, DM type 2, HOH,  HTN, neuromuscular disorder (neuropathy in hands and feet), hx of back surgery and hernia repair x2  are also affecting patient's functional outcome.      REHAB POTENTIAL: Good   CLINICAL DECISION MAKING: Evolving/moderate complexity   EVALUATION COMPLEXITY: Moderate     GOALS: Goals reviewed with patient? Yes   SHORT TERM GOALS: Target date: 07/21/2021     Patient will be independent in home exercise program to improve strength/mobility for better functional independence with ADLs. Baseline: 08/04/21: Pt is compliant with HEP, pt is performing AROM exercises at lower volume due to fatigability of R arm.  Goal status: IN PROGRESS       LONG TERM GOALS: Target date: 7//4/23     Patient will increase FOTO score to equal to or greater than 59  to demonstrate statistically significant improvement in mobility and quality of life.  Baseline: 5/9 38.   6/21 54 Goal status: IN PROGRESS    2.  Pt will increase right shoulder flexion and abduction to >140 degrees for improved ability to perform overhead activities. Baseline:  5/9 flexion 68d, abduction 50d.     6/21 flexion 52d, abduction 52d Goal status: NOT MET   3.  Pt will increase strength of global shoulder to at least 4/5 in order to demonstrate improvement in strength and function Baseline: 5/9: NT at eval.  6/21:  R shoulder: 2- for flexion/ABD, 4- ER, 4+ IR Goal status: IN PROGRESS   4.  Pt will decrease quick DASH score by at least 8% in order to demonstrate clinically significant reduction in disability. Baseline: 5/9 72.7%.  6/21: 20.5% Goal status: ACHIEVED     PLAN: PT FREQUENCY: 2x/week   PT DURATION: 4-6 weeks   PLANNED INTERVENTIONS: Therapeutic exercises, Therapeutic activity, Neuromuscular re-education, Balance training, Gait training, Patient/Family education, Joint mobilization, and Dry Needling   PLAN FOR NEXT SESSION: Continue AAROM/AROM and progress with strengthening of right shoulder as pt is able;  strengthen periscapular musculature, elbow, wrist and hand. Perform gentle GH mobs and oscillations for pain relief as needed.      Valentina Gu, PT, DPT #[id number]  Eilleen Kempf, PT 09/06/2021, 8:04 AM

## 2021-09-08 ENCOUNTER — Ambulatory Visit: Payer: Medicare HMO | Admitting: Physical Therapy

## 2021-09-08 DIAGNOSIS — M25511 Pain in right shoulder: Secondary | ICD-10-CM

## 2021-09-08 DIAGNOSIS — M6281 Muscle weakness (generalized): Secondary | ICD-10-CM

## 2021-09-08 NOTE — Therapy (Signed)
OUTPATIENT PHYSICAL THERAPY TREATMENT NOTE AND GOAL UPDATE/RE-CERTIFICATION   Patient Name: Alicia Shepard MRN: [id number] DOB:1955-08-12, 66 y.o., female Today's Date: 09/08/2021  PCP: [place] REFERRING PROVIDER: Nicholes Stairs, MD  END OF SESSION:   PT End of Session - 09/08/21 0847     Visit Number 17    Number of Visits 17    Date for PT Re-Evaluation 09/16/21    Progress Note Due on Visit 10    PT Start Time 0843    PT Stop Time 0925    PT Time Calculation (min) 42 min    Activity Tolerance Patient tolerated treatment well;Patient limited by pain    Behavior During Therapy [facility] for tasks assessed/performed             Past Medical History:  Diagnosis Date   Anxiety    Arthritis    Chronic kidney disease    CKD stage 3   COPD (chronic obstructive pulmonary disease) ([place])    Depression    Diabetes mellitus without complication ([place])    Type 2   GERD (gastroesophageal reflux disease)    History of kidney stones    HOH (hard of hearing)    Hypertension    Neuromuscular disorder ([place])    neuropathy feet and hands   Pneumonia    Past Surgical History:  Procedure Laterality Date   BACK SURGERY     HERNIA REPAIR     x2   REVERSE SHOULDER ARTHROPLASTY Right 05/07/2021   Procedure: REVERSE SHOULDER ARTHROPLASTY;  Surgeon: Nicholes Stairs, MD;  Location: [facility];  Service: Orthopedics;  Laterality: Right;   Patient Active Problem List   Diagnosis Date Noted   S/P reverse total shoulder arthroplasty, 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 She is now 6 weeks post-op. She  received therapy in the hospital as well as HH. She reports compliance with HEP provided by each however those mainly focused on wrist and elbow mobility. She has been very limited in  actively initiating use of right shoulder. She has progressed to feeding herself using her right hand; she bathes and dresses herself with the exception of donning a bra. Her grandson has lowered her desk so it is more comfortable to use her computer. She is able to type on a keyboard however using the mouse has been challenging; pt reports significant pain after attempting to be on the computer for a couple hours. 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 PMH: anxiety, arthritis, CKD stage 3, COPD, DM type 2, HOH, HTN, neuromuscular disorder (neuropathy in hands and feet), hx of back surgery and hernia repair x2.   PRECAUTIONS:  Week 4: begin outpatient therapy working on active range of motion; Week 6: begin passive range of motion and active assist.         OBJECTIVE: (objective measures completed at initial evaluation unless otherwise dated)     DIAGNOSTIC FINDINGS:  X-ray -- "Stable chronic proximal right humeral fracture abutting the humeral component of the right shoulder arthroplasty"   PATIENT SURVEYS:  Quick Dash: eval 72.7%,  08/04/21: 20.5% FOTO: eval 38/59,  08/04/21: 54/59.   09/08/21: 47/59   COGNITION:           Overall cognitive status: Within functional limits for tasks assessed                                  SENSATION: Light touch: Impaired  - RUE more sensitive through C3-T2 dermatomes   POSTURE: Forward head, rounded shoulders, mild kyphosis in supine    UPPER EXTREMITY ROM:    Active ROM Right 06/23/2021 Left 06/23/2021 Right 08/04/21 Right  09/08/21  Shoulder flexion 68   52 (85 supine) 55  Shoulder extension         Shoulder abduction 50   52  69  Shoulder adduction         Shoulder internal rotation 45   [facility] WFL  Shoulder external  rotation 0   45 36  Elbow flexion WFL       Elbow extension WFL       Wrist flexion WFL       Wrist extension WFL       Wrist ulnar deviation WFL       Wrist radial deviation WFL       Wrist pronation WFL       Wrist supination WFL       (Blank rows = not tested)   UPPER EXTREMITY MMT:   MMT Right 06/23/2021 Left 06/23/2021 Right 08/04/21 Right 09/08/21  Shoulder flexion NT 4 2- (3+ resisted test) 2- (4 resisted test)  Shoulder extension NT  4+     Shoulder abduction NT 4 2- (3+ resisted test) 2-  (4 resisted test)  Shoulder adduction NT       Shoulder internal rotation NT 4+ 4+ 4+  Shoulder external rotation NT 4- 4- 4  Middle trapezius NT       Lower trapezius NT       Elbow flexion NT 5 4+ 5-  Elbow extension NT _0 Wrist flexion         Wrist extension         Wrist ulnar deviation         Wrist radial deviation         Wrist pronation         Wrist supination         Grip strength (lbs)         (Blank rows = not tested)     JOINT MOBILITY TESTING:  NT - significant sensitivity to palpation   PALPATION:  Sensitive to right anterior, lateral and posterior shoulder, lateral upper arm, forearm, wrist, hand.  Most significant sensitivity to shoulder and upper arm.                  TREATMENT TODAY   SUBJECTIVE: Patient reports ongoing difficulty reaching up with her arm. Patient reports 65-70% SANE score at this time. Pt reports improvement in self-care e.g. washing hair, washing face, doffing shirt. Patient reports using modified workspace with lower keyboard for her computer. Patient reports some difficulties with opening/shutting doors, especially opening car door from inside. She reports limitation with reaching for eating utensils on higher restaurant table. Pt can grab banister for her stairs in her home better.    PAIN:  Are you having pain? 4/10 pain at arrival to PT Pain location: R lateral arm/tricep  TODAY'S TREATMENT:     Manual Therapy    *deferred today* Gentle oscillations for pain relief, RUE, x1 minute Grades II and III joint mobs to right GH joint, inferior mobs and posterior mobs; performed for pain relief and relaxation; 2x30 seconds each  Gentle PROM within patient tolerance for flexion, abduction, IR,  ER     *not today* STM/DTM R upper trapezius General cervical spine distraction in supine; x10 second bouts, x 10 repetitions Passive R upper trapezius stretching; 2x30sec PROM in supine - right shoulder into flexion, abduction, IR, ER and horizontal adduction; x5 minutes         Therapeutic Exercises    *Goal update performed*  Upper body ergometer, 2 minutes forward, 2 minutes backward - for R upper limb AAROM and soft tissue warm-up to improve muscle performance, improved soft tissue mobility/extensibility - subjective information gathered during this time     Shoulder flexion AROM in reclined position, lying on orange wedge for lawn chair progression; 2x10 - light manual assistance intermittently from therapist prn to attain full available ROM  Blue physioball roll across table; flexion and scaption; x15 each for active-assisted forward reaching               Yellow physioball roll up wall; x5 for active shoulder flexion                            Shoulder extension with single-arm; Red Tband linked to 2nd link from ceiling; 2x10, using eccentric phae for active-assisted shoulder elevation                       *next visit*                         Standing Row with Tband, Blue TBand; 2x8 with minimal cueing for scapular depression/retraction  Standing tricep extension with Green Tband; cueing for upright posture and body positioning to attain full elbow extension; 2x10; Grn Tband linked to 6th ring from top       *not today* Finger ladder for active shoulder elevation; x 2 minutes with 5 sec at top of patient's available shoulder elevation Shoulder dowel flexion AAROM in supine; x15 Swiss ball  roll across tabletop; flexion, single-arm (RUE); x20 Standing external rotation AROM bilaterally with scap retraction; 2x10  Standing shoulder flexion AAROM with dowel; 2x10 Supine shoulder ER AAROM with dowel; x20      *not today* MHP (unbilled) utilized post-treatment for analgesic effect and improved soft tissue extensibility, draped along R shoulder in sitting with pillow under patient's elbow, x 5 minutes.          *not today* Rhythmic stabilization at 90 deg forward elevation; 2x30 sec, light multi-directional perturbations AAROM shoulder ER using dowel, 2x10 Wall ladder into shoulder complex flexion, x1 minutes   2-way deltoid isometrics (shoulder flexion, ABD); x10, 5 sec AAROM chest press using dowel, 2x10 AAROM shoulder flexion using dowel, 2x10 AAROM shoulder abduction using dowel, 2x10 Pulleys, AAROM GH flexion, x2 minutes  Pulleys, AAROM GH abduction, x2 minutes Seated scapular retraction, 2x10 Seated bicep curls 1# DB, 2x10 SA row using YellowTB, RUE; 2x10       PATIENT EDUCATION: Education details: see above for patient education details   Person educated: Patient and Child(ren) Education method: Explanation and Demonstration Education comprehension: verbalized understanding and returned demonstration     HOME EXERCISE PROGRAM: Access Code: [email or web address]  ASSESSMENT:   CLINICAL IMPRESSION: Patient demonstrates minimally improved shoulder flexion AROM and notably improved abduction AROM. She has improved strength relative to her most recent progress note. Pt is still notably limited with lifting her R arm and full overhead AROM is not expected with rTSA. She has mild to moderate pain that is sometimes worsened with aggressive work on active shoulder elevation. She has met QuickDASH goal; she has yet to meet long-term FOTO goal. Pt has also not yet met long-term AROM goal for shoulder flexion and abduction. She gives good subjective appraisal of functional  progress to date with ability to perform self-care activities and use arm to negotiate her home; however, she still has limited functional RUE use necessitating further PT. Pt has remaining deficits in shoulder AROM, shoulder hiking/scapular elevation compensatory pattern, deltoid and periscapular strength, intermittent shoulder and proximal arm pain, and postural changes.  Pt will benefit from PT to improve the noted deficits as needed for upper limb functional use to allow return to work and improved QoL.          OBJECTIVE IMPAIRMENTS decreased ROM, decreased strength, hypomobility, impaired UE functional use, postural dysfunction, and pain.    ACTIVITY LIMITATIONS cleaning, community activity, driving, meal prep, occupation, and laundry.    PERSONAL FACTORS Age, Behavior pattern, Fitness, Time since onset of injury/illness/exacerbation, and 3+ comorbidities: anxiety, arthritis, CKD stage 3, COPD, DM type 2, HOH, HTN, neuromuscular disorder (neuropathy in hands and feet), hx of back surgery and hernia repair x2  are also affecting patient's functional outcome.      REHAB POTENTIAL: Good   CLINICAL DECISION MAKING: Evolving/moderate complexity   EVALUATION COMPLEXITY: Moderate     GOALS: Goals reviewed with patient? Yes   SHORT TERM GOALS: Target date: 07/21/2021     Patient will be independent in home exercise program to improve strength/mobility for better functional independence with ADLs. Baseline: 08/04/21: Pt is compliant with HEP, pt is performing AROM exercises at lower volume due to fatigability of R arm.   09/08/21: Pt is compliant with HEP Goal status: ACHIEVED       LONG TERM GOALS: Target date: 7//4/23     Patient will increase FOTO score to equal to or greater than 59  to demonstrate statistically significant improvement in mobility and quality of life.  Baseline: 5/9 38.   6/21 54.   09/08/21: 47 Goal status: IN PROGRESS    2.  Pt will increase right shoulder flexion  and abduction to >140 degrees for improved ability to perform overhead activities. Baseline:  5/9 flexion 68d, abduction 50d.     6/21 flexion 52d, abduction 52d.   7/26: flexion 55d, abduction 69d.   Goal status: NOT MET   3.  Pt will increase strength of global shoulder to at least 4/5 in order to demonstrate improvement in strength and function Baseline: 5/9: NT at eval.  6/21:  R shoulder: 2- for flexion/ABD, 4- ER, 4+ IR.    7/26: flexion and ABD 4, ER 4, IR 5-.   Goal status: IN PROGRESS   4.  Pt will decrease quick DASH score by at least 8% in order to demonstrate clinically significant reduction in disability. Baseline: 5/9 72.7%.  6/21: 20.5% Goal status: ACHIEVED     PLAN: PT FREQUENCY: 2x/week   PT DURATION: 4-6 weeks   PLANNED INTERVENTIONS: Therapeutic exercises, Therapeutic activity, Neuromuscular re-education, Balance training, Gait training, Patient/Family education, Joint mobilization, and Dry Needling   PLAN FOR NEXT SESSION: Continue AAROM/AROM and  progress with strengthening of right shoulder as pt is able; strengthen periscapular musculature, elbow, wrist and hand. Perform gentle GH mobs and oscillations for pain relief as needed.  Recommend continued PT 2x/week for 4-6 weeks.      Valentina Gu, PT, DPT #[id number]  Eilleen Kempf, PT 09/08/2021, 8:51 AM

## 2021-09-09 ENCOUNTER — Encounter: Payer: Self-pay | Admitting: Physical Therapy

## 2021-09-13 ENCOUNTER — Ambulatory Visit: Payer: Medicare HMO | Admitting: Physical Therapy

## 2021-09-13 ENCOUNTER — Encounter: Payer: Self-pay | Admitting: Physical Therapy

## 2021-09-13 DIAGNOSIS — M6281 Muscle weakness (generalized): Secondary | ICD-10-CM

## 2021-09-13 DIAGNOSIS — M25511 Pain in right shoulder: Secondary | ICD-10-CM

## 2021-09-13 NOTE — Therapy (Signed)
OUTPATIENT PHYSICAL THERAPY TREATMENT NOTE   Patient Name: Alicia Shepard MRN: [id number] DOB:Jan 15, 1956, 66 y.o., female Today's Date: 09/13/2021  PCP: [place] REFERRING PROVIDER: Nicholes Stairs, MD  END OF SESSION:   PT End of Session - 09/13/21 1236     Visit Number 18    Number of Visits 29    Date for PT Re-Evaluation 09/16/21    Progress Note Due on Visit 10    PT Start Time 1147    PT Stop Time 7169    PT Time Calculation (min) 44 min    Activity Tolerance Patient tolerated treatment well;Patient limited by pain    Behavior During Therapy WFL for tasks assessed/performed             Past Medical History:  Diagnosis Date   Anxiety    Arthritis    Chronic kidney disease    CKD stage 3   COPD (chronic obstructive pulmonary disease) ([place])    Depression    Diabetes mellitus without complication ([place])    Type 2   GERD (gastroesophageal reflux disease)    History of kidney stones    HOH (hard of hearing)    Hypertension    Neuromuscular disorder ([place])    neuropathy feet and hands   Pneumonia    Past Surgical History:  Procedure Laterality Date   BACK SURGERY     HERNIA REPAIR     x2   REVERSE SHOULDER ARTHROPLASTY Right 05/07/2021   Procedure: REVERSE SHOULDER ARTHROPLASTY;  Surgeon: Nicholes Stairs, MD;  Location: [facility];  Service: Orthopedics;  Laterality: Right;   Patient Active Problem List   Diagnosis Date Noted   S/P reverse total shoulder arthroplasty, 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 She is now 6 weeks post-op. She received therapy in the hospital as  well as HH. She reports compliance with HEP provided by each however those mainly focused on wrist and elbow mobility. She has been very limited in  actively initiating use of right shoulder. She has progressed to feeding herself using her right hand; she bathes and dresses herself with the exception of donning a bra. Her grandson has lowered her desk so it is more comfortable to use her computer. She is able to type on a keyboard however using the mouse has been challenging; pt reports significant pain after attempting to be on the computer for a couple hours. 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 PMH: anxiety, arthritis, CKD stage 3, COPD, DM type 2, HOH, HTN, neuromuscular disorder (neuropathy in hands and feet), hx of back surgery and hernia repair x2.   PRECAUTIONS:  Week 4: begin outpatient therapy working on active range of motion; Week 6: begin passive range of motion and active assist.         OBJECTIVE: (objective measures completed at initial evaluation unless otherwise dated)     DIAGNOSTIC FINDINGS:  X-ray -- "Stable chronic proximal right humeral fracture abutting the humeral component of the right shoulder arthroplasty"   PATIENT SURVEYS:  Quick Dash: eval 72.7%,  08/04/21: 20.5% FOTO: eval 38/59,  08/04/21: 54/59.   09/08/21: 47/59   COGNITION:           Overall cognitive status: Within functional limits for tasks assessed                                  SENSATION: Light touch: Impaired  - RUE more sensitive through C3-T2 dermatomes   POSTURE: Forward head, rounded shoulders, mild kyphosis in supine    UPPER EXTREMITY ROM:    Active ROM Right 06/23/2021 Left 06/23/2021 Right 08/04/21 Right  09/08/21  Shoulder flexion 68   52 (85 supine) 55  Shoulder extension          Shoulder abduction 50   52  69  Shoulder adduction          Shoulder internal rotation 45   [facility] WFL  Shoulder external rotation 0   45 36  Elbow flexion  WFL        Elbow extension WFL        Wrist flexion WFL        Wrist extension WFL        Wrist ulnar deviation WFL        Wrist radial deviation WFL        Wrist pronation WFL        Wrist supination WFL        (Blank rows = not tested)   UPPER EXTREMITY MMT:   MMT Right 06/23/2021 Left 06/23/2021 Right 08/04/21 Right 09/08/21  Shoulder flexion NT 4 2- (3+ resisted test) 2- (4 resisted test)  Shoulder extension NT  4+      Shoulder abduction NT 4 2- (3+ resisted test) 2-  (4 resisted test)  Shoulder adduction NT        Shoulder internal rotation NT 4+ 4+ 4+  Shoulder external rotation NT 4- 4- 4  Middle trapezius NT        Lower trapezius NT        Elbow flexion NT 5 4+ 5-  Elbow extension NT '5 4 4  ' Wrist flexion          Wrist extension          Wrist ulnar deviation          Wrist radial deviation          Wrist pronation          Wrist supination          Grip strength (lbs)          (Blank rows = not tested)     JOINT MOBILITY TESTING:  NT - significant sensitivity to palpation   PALPATION:  Sensitive to right anterior, lateral and posterior shoulder, lateral upper arm, forearm, wrist, hand.  Most significant sensitivity to shoulder and upper arm.                  TREATMENT TODAY   SUBJECTIVE: Patient reports her shoulder is doing well at arrival. Pt is compliant with HEP. She had recent Rx change for her GI symptoms. Pt is following up with gastroenterologist 10/12/21. Pt reports being able to pull grocery cart with her R arm today.   PAIN:  Are you having pain? No Pain location: R lateral arm/tricep       TODAY'S TREATMENT:    Therapeutic Exercises     Upper body ergometer, 2 minutes forward,  2 minutes backward - for R upper limb AAROM and soft tissue warm-up to improve muscle performance, improved soft tissue mobility/extensibility - subjective information gathered during this time     Shoulder flexion AROM in reclined position, lying on orange  wedge for lawn chair progression; 2x10 - light manual assistance intermittently from therapist prn to attain full available ROM  Supine chops; forward flexion at 90 deg; 2x20    Silver physioball roll up wall; x10 for active shoulder flexion                            Shoulder extension with single-arm; Red Tband linked to 2nd link from ceiling; 1x10, using eccentric phae for active-assisted shoulder elevation    Standing tricep extension with Green Tband; cueing for upright posture and body positioning to attain full elbow extension; 1x5 with Blue Tband; 1x10 with Green Tband linked to 7th ring from top  -significant difficulty attaining full shoulder extension, will adjust intensity next visit                                            *next visit*                         Standing Row with Tband, Blue TBand; 2x8 with minimal cueing for scapular depression/retraction     *not today* Blue physioball roll across table; flexion and scaption; x15 each for active-assisted forward reaching Finger ladder for active shoulder elevation; x 2 minutes with 5 sec at top of patient's available shoulder elevation Shoulder dowel flexion AAROM in supine; x15 Swiss ball roll across tabletop; flexion, single-arm (RUE); x20 Standing external rotation AROM bilaterally with scap retraction; 2x10  Standing shoulder flexion AAROM with dowel; 2x10 Supine shoulder ER AAROM with dowel; x20      *not today* MHP (unbilled) utilized post-treatment for analgesic effect and improved soft tissue extensibility, draped along R shoulder in sitting with pillow under patient's elbow, x 5 minutes.          *not today* Rhythmic stabilization at 90 deg forward elevation; 2x30 sec, light multi-directional perturbations AAROM shoulder ER using dowel, 2x10 Wall ladder into shoulder complex flexion, x1 minutes   2-way deltoid isometrics (shoulder flexion, ABD); x10, 5 sec AAROM chest press using dowel, 2x10 AAROM shoulder  flexion using dowel, 2x10 AAROM shoulder abduction using dowel, 2x10 Pulleys, AAROM GH flexion, x2 minutes  Pulleys, AAROM GH abduction, x2 minutes Seated scapular retraction, 2x10 Seated bicep curls 1# DB, 2x10 SA row using YellowTB, RUE; 2x10       PATIENT EDUCATION: Education details: see above for patient education details   Person educated: Patient and Child(ren) Education method: Customer service manager Education comprehension: verbalized understanding and returned demonstration     HOME EXERCISE PROGRAM: Access Code: [email or web address]       ASSESSMENT:   CLINICAL IMPRESSION: Patient is able to move between 70-90 deg forward elevation actively in gravity-eliminated position today as demonstrated by performance of "chop" in supine to re-train deltoid for active shoulder flexion. Patient is doing better with her home exercises and reports ongoing improvements in ability to complete ADLs and grasping/pulling tasks with operative UE. Pt has remaining deficits in shoulder AROM, shoulder hiking/scapular elevation compensatory pattern, deltoid and periscapular strength, intermittent shoulder and proximal arm pain, and postural  changes.  Pt will benefit from PT to improve the noted deficits as needed for upper limb functional use to allow return to work and improved QoL.          OBJECTIVE IMPAIRMENTS decreased ROM, decreased strength, hypomobility, impaired UE functional use, postural dysfunction, and pain.    ACTIVITY LIMITATIONS cleaning, community activity, driving, meal prep, occupation, and laundry.    PERSONAL FACTORS Age, Behavior pattern, Fitness, Time since onset of injury/illness/exacerbation, and 3+ comorbidities: anxiety, arthritis, CKD stage 3, COPD, DM type 2, HOH, HTN, neuromuscular disorder (neuropathy in hands and feet), hx of back surgery and hernia repair x2  are also affecting patient's functional outcome.      REHAB POTENTIAL: Good   CLINICAL DECISION MAKING:  Evolving/moderate complexity   EVALUATION COMPLEXITY: Moderate     GOALS: Goals reviewed with patient? Yes   SHORT TERM GOALS: Target date: 07/21/2021     Patient will be independent in home exercise program to improve strength/mobility for better functional independence with ADLs. Baseline: 08/04/21: Pt is compliant with HEP, pt is performing AROM exercises at lower volume due to fatigability of R arm.   09/08/21: Pt is compliant with HEP Goal status: ACHIEVED       LONG TERM GOALS: Target date: 7//4/23     Patient will increase FOTO score to equal to or greater than 59  to demonstrate statistically significant improvement in mobility and quality of life.  Baseline: 5/9 38.   6/21 54.   09/08/21: 47 Goal status: IN PROGRESS    2.  Pt will increase right shoulder flexion and abduction to >140 degrees for improved ability to perform overhead activities. Baseline:  5/9 flexion 68d, abduction 50d.     6/21 flexion 52d, abduction 52d.   7/26: flexion 55d, abduction 69d.   Goal status: NOT MET   3.  Pt will increase strength of global shoulder to at least 4/5 in order to demonstrate improvement in strength and function Baseline: 5/9: NT at eval.  6/21:  R shoulder: 2- for flexion/ABD, 4- ER, 4+ IR.    7/26: flexion and ABD 4, ER 4, IR 5-.   Goal status: IN PROGRESS   4.  Pt will decrease quick DASH score by at least 8% in order to demonstrate clinically significant reduction in disability. Baseline: 5/9 72.7%.  6/21: 20.5% Goal status: ACHIEVED     PLAN: PT FREQUENCY: 2x/week   PT DURATION: 4-6 weeks   PLANNED INTERVENTIONS: Therapeutic exercises, Therapeutic activity, Neuromuscular re-education, Balance training, Gait training, Patient/Family education, Joint mobilization, and Dry Needling   PLAN FOR NEXT SESSION: Continue AAROM/AROM and progress with strengthening of right shoulder as pt is able; strengthen periscapular musculature, elbow, wrist and hand. Perform gentle GH mobs and  oscillations for pain relief as needed.       Valentina Gu, PT, DPT #[id number]  Eilleen Kempf, PT 09/13/2021, 12:36 PM

## 2021-09-15 ENCOUNTER — Ambulatory Visit: Payer: Medicare HMO | Attending: Orthopedic Surgery | Admitting: Physical Therapy

## 2021-09-15 ENCOUNTER — Encounter: Payer: Self-pay | Admitting: Physical Therapy

## 2021-09-15 DIAGNOSIS — M6281 Muscle weakness (generalized): Secondary | ICD-10-CM | POA: Diagnosis present

## 2021-09-15 DIAGNOSIS — M25511 Pain in right shoulder: Secondary | ICD-10-CM | POA: Diagnosis present

## 2021-09-15 NOTE — Therapy (Unsigned)
OUTPATIENT PHYSICAL THERAPY TREATMENT NOTE   Patient Name: Alicia Shepard MRN: [id number] DOB:[birth year], 66 y.o., female Today's Date: 09/15/2021  PCP: [place], [place] REFERRING PROVIDER: Nicholes Stairs, MD  END OF SESSION:   PT End of Session - 09/15/21 0854     Visit Number 19    Number of Visits 29    Date for PT Re-Evaluation 09/16/21    Progress Note Due on Visit 10    PT Start Time 0851    PT Stop Time 0930    PT Time Calculation (min) 39 min    Activity Tolerance Patient tolerated treatment well;Patient limited by pain    Behavior During Therapy [facility] for tasks assessed/performed             Past Medical History:  Diagnosis Date   Anxiety    Arthritis    Chronic kidney disease    CKD stage 3   COPD (chronic obstructive pulmonary disease) ([place])    Depression    Diabetes mellitus without complication ([place])    Type 2   GERD (gastroesophageal reflux disease)    History of kidney stones    HOH (hard of hearing)    Hypertension    Neuromuscular disorder ([place])    neuropathy feet and hands   Pneumonia    Past Surgical History:  Procedure Laterality Date   BACK SURGERY     HERNIA REPAIR     x2   REVERSE SHOULDER ARTHROPLASTY Right 05/07/2021   Procedure: REVERSE SHOULDER ARTHROPLASTY;  Surgeon: Nicholes Stairs, MD;  Location: [facility];  Service: Orthopedics;  Laterality: Right;   Patient Active Problem List   Diagnosis Date Noted   S/P reverse total shoulder arthroplasty, 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 She is now 6 weeks post-op. She received therapy in the hospital as  well as HH. She reports compliance with HEP provided by each however those mainly focused on wrist and elbow mobility. She has been very limited in  actively initiating use of right shoulder. She has progressed to feeding herself using her right hand; she bathes and dresses herself with the exception of donning a bra. Her grandson has lowered her desk so it is more comfortable to use her computer. She is able to type on a keyboard however using the mouse has been challenging; pt reports significant pain after attempting to be on the computer for a couple hours. 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 PMH: anxiety, arthritis, CKD stage 3, COPD, DM type 2, HOH, HTN, neuromuscular disorder (neuropathy in hands and feet), hx of back surgery and hernia repair x2.   PRECAUTIONS:  Week 4: begin outpatient therapy working on active range of motion; Week 6: begin passive range of motion and active assist.         OBJECTIVE: (objective measures completed at initial evaluation unless otherwise dated)     DIAGNOSTIC FINDINGS:  X-ray -- "Stable chronic proximal right humeral fracture abutting the humeral component of the right shoulder arthroplasty"   PATIENT SURVEYS:  Quick Dash: eval 72.7%,  08/04/21: 20.5% FOTO: eval 38/59,  08/04/21: 54/59.   09/08/21: 47/59   COGNITION:           Overall cognitive status: Within functional limits for tasks assessed                                  SENSATION: Light touch: Impaired  - RUE more sensitive through C3-T2 dermatomes   POSTURE: Forward head, rounded shoulders, mild kyphosis in supine    UPPER EXTREMITY ROM:    Active ROM Right 06/23/2021 Left 06/23/2021 Right 08/04/21 Right  09/08/21  Shoulder flexion 68   52 (85 supine) 55  Shoulder extension          Shoulder abduction 50   52  69  Shoulder adduction          Shoulder internal rotation 45   [facility] WFL  Shoulder external rotation 0   45 36  Elbow flexion  WFL        Elbow extension WFL        Wrist flexion WFL        Wrist extension WFL        Wrist ulnar deviation WFL        Wrist radial deviation WFL        Wrist pronation WFL        Wrist supination WFL        (Blank rows = not tested)   UPPER EXTREMITY MMT:   MMT Right 06/23/2021 Left 06/23/2021 Right 08/04/21 Right 09/08/21  Shoulder flexion NT 4 2- (3+ resisted test) 2- (4 resisted test)  Shoulder extension NT  4+      Shoulder abduction NT 4 2- (3+ resisted test) 2-  (4 resisted test)  Shoulder adduction NT        Shoulder internal rotation NT 4+ 4+ 4+  Shoulder external rotation NT 4- 4- 4  Middle trapezius NT        Lower trapezius NT        Elbow flexion NT 5 4+ 5-  Elbow extension NT '5 4 4  ' Wrist flexion          Wrist extension          Wrist ulnar deviation          Wrist radial deviation          Wrist pronation          Wrist supination          Grip strength (lbs)          (Blank rows = not tested)     JOINT MOBILITY TESTING:  NT - significant sensitivity to palpation   PALPATION:  Sensitive to right anterior, lateral and posterior shoulder, lateral upper arm, forearm, wrist, hand.  Most significant sensitivity to shoulder and upper arm.                  TREATMENT TODAY   SUBJECTIVE: Patient reports pain this AM along R upper trapezius. She reports she has worked on her HEP each day. She feels that she can proceed with AROM and strengthening today with relatively mild pain in upper trapezius.    PAIN:  Are you having pain? yes, 3-4/10 along L upper trapeius Pain location: R lateral arm/tricep       TODAY'S TREATMENT:     Therapeutic Exercises     Upper body ergometer, 2 minutes  forward, 2 minutes backward - for R upper limb AAROM and soft tissue warm-up to improve muscle performance, improved soft tissue mobility/extensibility - subjective information gathered during this time     Shoulder flexion AROM in reclined position, lying on orange  wedge for lawn chair progression; 2x10 - light manual assistance intermittently from therapist prn to attain full available ROM   Supine chops; forward flexion at 90 deg; 2x30  Pulleys, for active-assisted forward elevation, in sitting; x 2 minutes  Wall slide with towel, x5 for active shoulder forward flexion     Standing Row with Tband, Blue TBand; 2x10 with minimal cueing for scapular depression/retraction                                                                           *not today*         Shoulder extension with single-arm; Red Tband linked to 2nd link from ceiling; 1x10, using eccentric phae for active-assisted shoulder elevation    Standing tricep extension with Green Tband; cueing for upright posture and body positioning to attain full elbow extension; 1x5 with Blue Tband; 1x10 with Green Tband linked to 7th ring from top             -significant difficulty attaining full shoulder extension, will adjust intensity next visit                   Silver physioball roll up wall; x10 for active shoulder flexion  Blue physioball roll across table; flexion and scaption; x15 each for active-assisted forward reaching Finger ladder for active shoulder elevation; x 2 minutes with 5 sec at top of patient's available shoulder elevation Shoulder dowel flexion AAROM in supine; x15 Swiss ball roll across tabletop; flexion, single-arm (RUE); x20 Standing external rotation AROM bilaterally with scap retraction; 2x10  Standing shoulder flexion AAROM with dowel; 2x10 Supine shoulder ER AAROM with dowel; x20     Manual Therapy - for symptom modulation, soft tissue sensitivity and mobility as needed for improved participation in active PT intervention  DTM/TPR R UT; x 5 minutes    MHP (unbilled) utilized post-treatment for analgesic effect and improved soft tissue extensibility, draped along R upper trapezius in sitting with pillow under patient's elbow, x 5 minutes.          *not  today* Rhythmic stabilization at 90 deg forward elevation; 2x30 sec, light multi-directional perturbations AAROM shoulder ER using dowel, 2x10 Wall ladder into shoulder complex flexion, x1 minutes   2-way deltoid isometrics (shoulder flexion, ABD); x10, 5 sec AAROM chest press using dowel, 2x10 AAROM shoulder flexion using dowel, 2x10 AAROM shoulder abduction using dowel, 2x10 Pulleys, AAROM GH flexion, x2 minutes  Pulleys, AAROM GH abduction, x2 minutes Seated scapular retraction, 2x10 Seated bicep curls 1# DB, 2x10 SA row using YellowTB, RUE; 2x10       PATIENT EDUCATION: Education details: see above for patient education details   Person educated: Patient and Child(ren) Education method: Customer service manager Education comprehension: verbalized understanding and returned demonstration     HOME EXERCISE PROGRAM: Access Code: [email or web address]       ASSESSMENT:   CLINICAL IMPRESSION: Patient has difficulty participating in AROM exercise given aggravation of R upper  trapezius pain that began this AM. Patient has taut and tender R UT and responds well with manual therapy and use of thermotherapy. She does have intermittent pain in upper arm with attempted active R shoulder elevation with minimal assistance. Active shoulder flexion in reclined position is continuing to improve. Pt has remaining deficits in shoulder AROM, shoulder hiking/scapular elevation compensatory pattern, deltoid and periscapular strength, intermittent shoulder and proximal arm pain, and postural changes.  Pt will benefit from PT to improve the noted deficits as needed for upper limb functional use to allow return to work and improved QoL.          OBJECTIVE IMPAIRMENTS decreased ROM, decreased strength, hypomobility, impaired UE functional use, postural dysfunction, and pain.    ACTIVITY LIMITATIONS cleaning, community activity, driving, meal prep, occupation, and laundry.    PERSONAL FACTORS Age, Behavior  pattern, Fitness, Time since onset of injury/illness/exacerbation, and 3+ comorbidities: anxiety, arthritis, CKD stage 3, COPD, DM type 2, HOH, HTN, neuromuscular disorder (neuropathy in hands and feet), hx of back surgery and hernia repair x2  are also affecting patient's functional outcome.      REHAB POTENTIAL: Good   CLINICAL DECISION MAKING: Evolving/moderate complexity   EVALUATION COMPLEXITY: Moderate     GOALS: Goals reviewed with patient? Yes   SHORT TERM GOALS: Target date: 07/21/2021     Patient will be independent in home exercise program to improve strength/mobility for better functional independence with ADLs. Baseline: 08/04/21: Pt is compliant with HEP, pt is performing AROM exercises at lower volume due to fatigability of R arm.   09/08/21: Pt is compliant with HEP Goal status: ACHIEVED       LONG TERM GOALS: Target date: 7//4/23     Patient will increase FOTO score to equal to or greater than 59  to demonstrate statistically significant improvement in mobility and quality of life.  Baseline: 5/9 38.   6/21 54.   09/08/21: 47 Goal status: IN PROGRESS    2.  Pt will increase right shoulder flexion and abduction to >140 degrees for improved ability to perform overhead activities. Baseline:  5/9 flexion 68d, abduction 50d.     6/21 flexion 52d, abduction 52d.   7/26: flexion 55d, abduction 69d.   Goal status: NOT MET   3.  Pt will increase strength of global shoulder to at least 4/5 in order to demonstrate improvement in strength and function Baseline: 5/9: NT at eval.  6/21:  R shoulder: 2- for flexion/ABD, 4- ER, 4+ IR.    7/26: flexion and ABD 4, ER 4, IR 5-.   Goal status: IN PROGRESS   4.  Pt will decrease quick DASH score by at least 8% in order to demonstrate clinically significant reduction in disability. Baseline: 5/9 72.7%.  6/21: 20.5% Goal status: ACHIEVED     PLAN: PT FREQUENCY: 2x/week   PT DURATION: 4-6 weeks   PLANNED INTERVENTIONS: Therapeutic  exercises, Therapeutic activity, Neuromuscular re-education, Balance training, Gait training, Patient/Family education, Joint mobilization, and Dry Needling   PLAN FOR NEXT SESSION: Continue AAROM/AROM and progress with strengthening of right shoulder as pt is able; strengthen periscapular musculature, elbow, wrist and hand. Perform gentle GH mobs and oscillations for pain relief as needed.      Valentina Gu, PT, DPT #[id number]  Eilleen Kempf, PT 09/15/2021, 8:55 AM

## 2021-09-20 ENCOUNTER — Encounter: Payer: Self-pay | Admitting: Physical Therapy

## 2021-09-20 ENCOUNTER — Ambulatory Visit: Payer: Medicare HMO | Admitting: Physical Therapy

## 2021-09-20 DIAGNOSIS — M25511 Pain in right shoulder: Secondary | ICD-10-CM

## 2021-09-20 DIAGNOSIS — M6281 Muscle weakness (generalized): Secondary | ICD-10-CM

## 2021-09-20 NOTE — Therapy (Unsigned)
OUTPATIENT PHYSICAL THERAPY TREATMENT AND PROGRESS NOTE   Dates of reporting period  08/04/21   to   09/20/21    Patient Name: Alicia Shepard MRN: [id number] DOB:02/24/1955, 66 y.o., female Today's Date: 09/20/2021  PCP: [place] REFERRING PROVIDER: Nicholes Stairs, MD  END OF SESSION:   PT End of Session - 09/20/21 1509     Visit Number 20    Number of Visits 29    Date for PT Re-Evaluation 09/16/21    Progress Note Due on Visit 10    PT Start Time 1504    PT Stop Time 1545    PT Time Calculation (min) 41 min    Activity Tolerance Patient tolerated treatment well;Patient limited by pain    Behavior During Therapy WFL for tasks assessed/performed             Past Medical History:  Diagnosis Date   Anxiety    Arthritis    Chronic kidney disease    CKD stage 3   COPD (chronic obstructive pulmonary disease) ([place])    Depression    Diabetes mellitus without complication ([place])    Type 2   GERD (gastroesophageal reflux disease)    History of kidney stones    HOH (hard of hearing)    Hypertension    Neuromuscular disorder ([place])    neuropathy feet and hands   Pneumonia    Past Surgical History:  Procedure Laterality Date   BACK SURGERY     HERNIA REPAIR     x2   REVERSE SHOULDER ARTHROPLASTY Right 05/07/2021   Procedure: REVERSE SHOULDER ARTHROPLASTY;  Surgeon: Nicholes Stairs, MD;  Location: [facility];  Service: Orthopedics;  Laterality: Right;   Patient Active Problem List   Diagnosis Date Noted   S/P reverse total shoulder arthroplasty, 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  She is now 6 weeks post-op. She received therapy in the hospital as well as HH. She reports compliance with HEP provided by each however those mainly focused on wrist and elbow mobility. She has been very limited in  actively initiating use of right shoulder. She has progressed to feeding herself using her right hand; she bathes and dresses herself with the exception of donning a bra. Her grandson has lowered her desk so it is more comfortable to use her computer. She is able to type on a keyboard however using the mouse has been challenging; pt reports significant pain after attempting to be on the computer for a couple hours. 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 PMH: anxiety, arthritis, CKD stage 3, COPD, DM type 2, HOH, HTN, neuromuscular disorder (neuropathy in hands and feet), hx of back surgery and hernia repair x2.   PRECAUTIONS:  Week 4: begin outpatient therapy working on active range of motion; Week 6: begin passive range of motion and active assist.         OBJECTIVE: (objective measures completed at initial evaluation unless otherwise dated)     DIAGNOSTIC FINDINGS:  X-ray -- "Stable chronic proximal right humeral  fracture abutting the humeral component of the right shoulder arthroplasty"   PATIENT SURVEYS:  Quick Dash: eval 72.7%,  08/04/21: 20.5% FOTO: eval 38/59,  08/04/21: 54/59.   09/08/21: 47/59   09/20/21: 50/59   COGNITION:           Overall cognitive status: Within functional limits for tasks assessed                                  SENSATION: Light touch: Impaired  - RUE more sensitive through C3-T2 dermatomes   POSTURE: Forward head, rounded shoulders, mild kyphosis in supine    UPPER EXTREMITY ROM:   Active ROM Right 06/23/2021 Left 06/23/2021 Right 08/04/21 Right  09/08/21 Right 09/20/21  Shoulder flexion 68   52 (85 supine) 55 64  Shoulder extension           Shoulder abduction 50   52  69 73  Shoulder adduction            Shoulder internal rotation 45   WFL [facility] WFL  Shoulder external rotation 0   45 36 55  Elbow flexion WFL         Elbow extension WFL         Wrist flexion WFL         Wrist extension WFL         Wrist ulnar deviation WFL         Wrist radial deviation WFL         Wrist pronation WFL         Wrist supination WFL         (Blank rows = not tested)   UPPER EXTREMITY MMT:   MMT Right 06/23/2021 Left 06/23/2021 Right 08/04/21 Right 09/08/21 Right 09/20/21  Shoulder flexion NT 4 2- (3+ resisted test) 2- (4 resisted test) 2-  (4 resisted test)  Shoulder extension NT  4+       Shoulder abduction NT 4 2- (3+ resisted test) 2-  (4 resisted test) 2-  (4+ resisted test)  Shoulder adduction NT         Shoulder internal rotation NT 4+ 4+ 4+ 4+  Shoulder external rotation NT 4- 4- 4 4  Middle trapezius NT         Lower trapezius NT         Elbow flexion NT 5 4+ 5- 5-  Elbow extension NT '5 4 4 4  ' Wrist flexion           Wrist extension           Wrist ulnar deviation           Wrist radial deviation           Wrist pronation           Wrist supination           Grip strength (lbs)           (Blank rows = not tested)     JOINT MOBILITY TESTING:  NT - significant sensitivity to palpation   PALPATION:  Sensitive to right anterior, lateral and posterior shoulder, lateral upper arm, forearm, wrist, hand.  Most significant sensitivity to shoulder and upper arm.                  TREATMENT TODAY   SUBJECTIVE: Patient reports she can do a lot more activity now. 75-80% SANE score. Patient  reports she needs to be able to lift more weight for return to prior level. She reports being able to put on make-up the day before yesterday without notable difficulty. Pt reports she needs improved strength in her R arm - able to raise up arm higher.    PAIN:  Are you having pain? No Pain location: R lateral arm/tricep       TODAY'S TREATMENT:    Therapeutic Activities  Re-assessment  performed (see Objective and updated Goal section below)   PATIENT EDUCATION: Discussed current progress with PT, prognosis/expectations with rTSA, continued POC     Therapeutic Exercises    Upper body ergometer, 2 minutes forward, 2 minutes backward - for R upper limb AAROM and soft tissue warm-up to improve muscle performance, improved soft tissue mobility/extensibility - subjective information gathered during this time     Shoulder flexion AROM in reclined position, lying on orange wedge for lawn chair progression; 2x10 - light manual assistance intermittently from therapist prn to attain full available ROM  Pulleys, for active-assisted forward elevation, in sitting; x 2 minutes               Standing Row with Tband, Blue TBand; 2x10 with minimal cueing for scapular depression/retraction                                              *next visit*                Supine chops; forward flexion at 90 deg; 2x30       *not today* Wall slide with towel, x5 for active shoulder forward flexion Shoulder extension with single-arm; Red Tband linked to 2nd link from ceiling; 1x10, using eccentric phae for active-assisted shoulder elevation  Standing tricep extension with Green Tband; cueing for upright posture and body positioning to attain full elbow extension; 1x5 with Blue Tband; 1x10 with Green Tband linked to 7th ring from top             -significant difficulty attaining full shoulder extension, will adjust intensity next visit Silver physioball roll up wall; x10 for active shoulder flexion  Blue physioball roll across table; flexion and scaption; x15 each for active-assisted forward reaching Finger ladder for active shoulder elevation; x 2 minutes with 5 sec at top of patient's available shoulder elevation Shoulder dowel flexion AAROM in supine; x15 Swiss ball roll across tabletop; flexion, single-arm (RUE); x20 Standing external rotation AROM bilaterally with scap retraction; 2x10   Standing shoulder flexion AAROM with dowel; 2x10 Supine shoulder ER AAROM with dowel; x20  Rhythmic stabilization at 90 deg forward elevation; 2x30 sec, light multi-directional perturbations AAROM shoulder ER using dowel, 2x10 Wall ladder into shoulder complex flexion, x1 minutes   2-way deltoid isometrics (shoulder flexion, ABD); x10, 5 sec AAROM chest press using dowel, 2x10 AAROM shoulder flexion using dowel, 2x10 AAROM shoulder abduction using dowel, 2x10 Pulleys, AAROM GH flexion, x2 minutes  Pulleys, AAROM GH abduction, x2 minutes Seated scapular retraction, 2x10 Seated bicep curls 1# DB, 2x10 SA row using YellowTB, RUE; 2x10       PATIENT EDUCATION: Education details: see above for patient education details   Person educated: Patient and Child(ren) Education method: Explanation and Demonstration Education comprehension: verbalized understanding and returned demonstration     HOME EXERCISE PROGRAM: Access Code: [email or web address]       ASSESSMENT:  CLINICAL IMPRESSION: Patient has modest improvement in shoulder AROM - there may be ceiling effect for shoulder elevation AROM given history or rTSA. Patient has fair strength goal within her available range, though her flexion and abduction MMT score are objectively limited by poor AROM. Patient has made further progress with FOTO score and  pt reports ongoing positive subjective reports for improved ability to perform household work, reaching tasks, and ADLs with RUE. Pt has remaining deficits in shoulder AROM, deltoid and periscapular strength, intermittent shoulder and proximal arm pain, and postural changes.  Pt will benefit from PT to improve the noted deficits as needed for upper limb functional use to allow return to work and improved QoL.          OBJECTIVE IMPAIRMENTS decreased ROM, decreased strength, hypomobility, impaired UE functional use, postural dysfunction, and pain.    ACTIVITY LIMITATIONS cleaning, community  activity, driving, meal prep, occupation, and laundry.    PERSONAL FACTORS Age, Behavior pattern, Fitness, Time since onset of injury/illness/exacerbation, and 3+ comorbidities: anxiety, arthritis, CKD stage 3, COPD, DM type 2, HOH, HTN, neuromuscular disorder (neuropathy in hands and feet), hx of back surgery and hernia repair x2  are also affecting patient's functional outcome.      REHAB POTENTIAL: Good   CLINICAL DECISION MAKING: Evolving/moderate complexity   EVALUATION COMPLEXITY: Moderate     GOALS: Goals reviewed with patient? Yes   SHORT TERM GOALS: Target date: 07/21/2021     Patient will be independent in home exercise program to improve strength/mobility for better functional independence with ADLs. Baseline: 08/04/21: Pt is compliant with HEP, pt is performing AROM exercises at lower volume due to fatigability of R arm.   09/08/21: Pt is compliant with HEP Goal status: ACHIEVED       LONG TERM GOALS: Target date: 7//4/23     Patient will increase FOTO score to equal to or greater than 59  to demonstrate statistically significant improvement in mobility and quality of life.  Baseline: 5/9 38.   6/21 54.   09/08/21: 47.   09/20/21: 50.   Goal status: IN PROGRESS    2.  Pt will increase right shoulder flexion and abduction to >140 degrees for improved ability to perform overhead activities. Baseline:  5/9 flexion 68d, abduction 50d.     6/21 flexion 52d, abduction 52d.   7/26: flexion 55d, abduction 69d.    8/07: flexion 64d, abduction 73d.   Goal status: NOT MET   3.  Pt will increase strength of global shoulder to at least 4/5 in order to demonstrate improvement in strength and function Baseline: 5/9: NT at eval.  6/21:  R shoulder: 2- for flexion/ABD, 4- ER, 4+ IR.    7/26: flexion and ABD 4, ER 4, IR 5-.      7/26: flexion and ABD 4, ER 4, IR 5-.  8/07: flexion 4, abduction 4+, ER 4, IR 4+ (flexion and abduction tested in available range, pt has limited AROM).    Goal  status: MOSTLY MET   4.  Pt will decrease quick DASH score by at least 8% in order to demonstrate clinically significant reduction in disability. Baseline: 5/9 72.7%.  6/21: 20.5% Goal status: ACHIEVED     PLAN: PT FREQUENCY: 2x/week   PT DURATION: 4-6 weeks   PLANNED INTERVENTIONS: Therapeutic exercises, Therapeutic activity, Neuromuscular re-education, Balance training, Gait training, Patient/Family education, Joint mobilization, and Dry Needling   PLAN FOR NEXT SESSION: Continue AAROM/AROM and progress with strengthening of right shoulder  as pt is able; strengthen periscapular musculature, elbow, wrist and hand. Perform gentle GH mobs and oscillations for pain relief as needed.  Recommend continued PT 2x/week for 4 weeks.        Valentina Gu, PT, DPT #[id number]  Eilleen Kempf, PT 09/20/2021, 3:10 PM

## 2021-09-22 ENCOUNTER — Ambulatory Visit: Payer: Medicare HMO | Admitting: Physical Therapy

## 2021-09-27 ENCOUNTER — Ambulatory Visit: Payer: Medicare HMO | Admitting: Physical Therapy

## 2021-09-29 ENCOUNTER — Ambulatory Visit: Payer: Medicare HMO | Admitting: Physical Therapy

## 2021-10-04 ENCOUNTER — Ambulatory Visit: Payer: Medicare HMO | Admitting: Physical Therapy

## 2021-10-06 ENCOUNTER — Encounter: Payer: Medicare HMO | Admitting: Physical Therapy

## 2021-10-11 ENCOUNTER — Ambulatory Visit: Payer: Medicare HMO | Admitting: Physical Therapy

## 2021-10-13 ENCOUNTER — Encounter: Payer: Medicare HMO | Admitting: Physical Therapy

## 2021-11-25 ENCOUNTER — Other Ambulatory Visit: Payer: Self-pay | Admitting: Family

## 2021-11-25 DIAGNOSIS — M545 Low back pain, unspecified: Secondary | ICD-10-CM

## 2022-05-23 ENCOUNTER — Other Ambulatory Visit: Payer: Self-pay | Admitting: Family

## 2022-05-23 ENCOUNTER — Ambulatory Visit
Admission: RE | Admit: 2022-05-23 | Discharge: 2022-05-23 | Disposition: A | Discharge status: Home / Self Care | Payer: Medicare PPO | Attending: Family | Admitting: Family

## 2022-05-23 ENCOUNTER — Ambulatory Visit
Admission: RE | Admit: 2022-05-23 | Discharge: 2022-05-23 | Disposition: A | Discharge status: Home / Self Care | Payer: Medicare PPO | Source: Ambulatory Visit | Attending: Family | Admitting: Family

## 2022-05-23 DIAGNOSIS — R053 Chronic cough: Secondary | ICD-10-CM | POA: Insufficient documentation

## 2022-08-08 ENCOUNTER — Other Ambulatory Visit: Payer: Self-pay

## 2022-11-14 ENCOUNTER — Ambulatory Visit: Payer: Medicare PPO | Admitting: Urology

## 2023-05-16 IMAGING — US US RENAL
1 series · 14 of 25 positions shown · non-contrast
Comparison: None.

CLINICAL DATA: Chronic kidney disease, urinary retention

EXAM:
RENAL / URINARY TRACT ULTRASOUND COMPLETE

[Series 1: us renal · 14 of 34 slices shown]
[im 1/34]
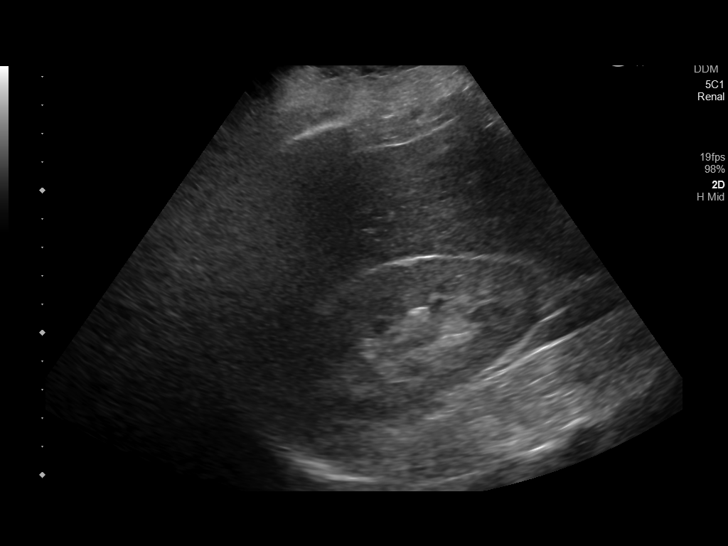
[im 3/34]
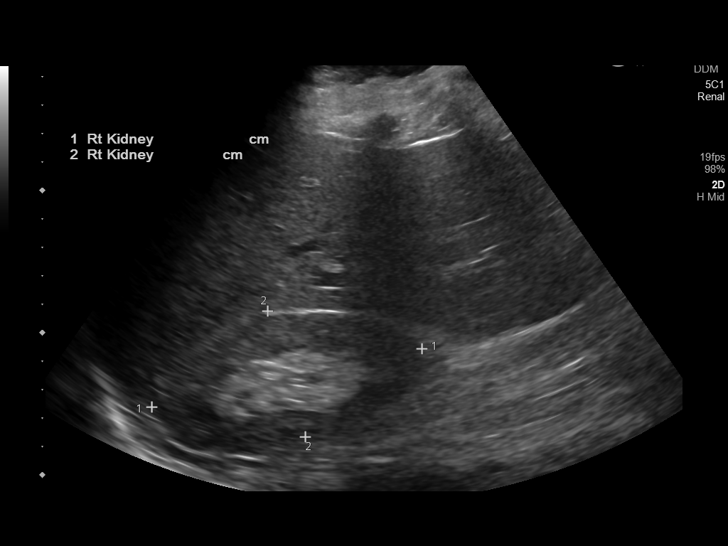
[im 6/34]
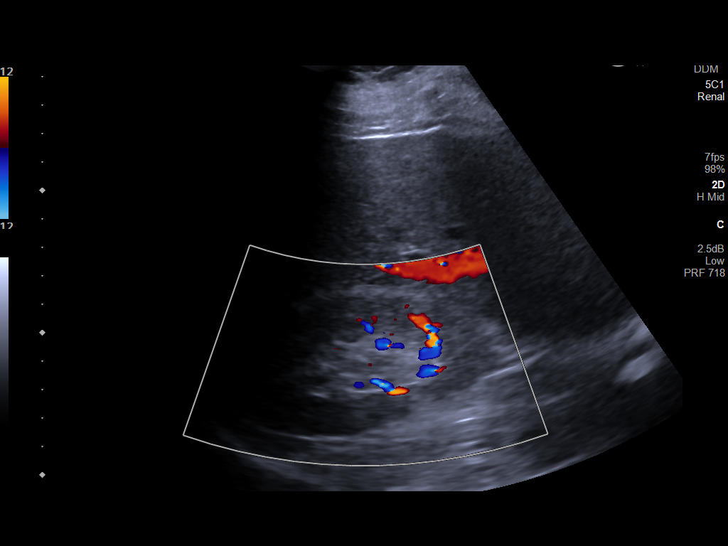
[im 9/34]
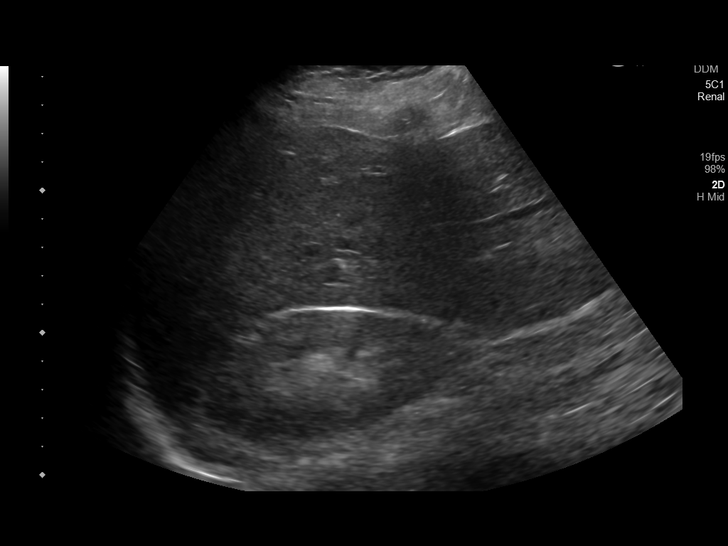
[im 12/34]
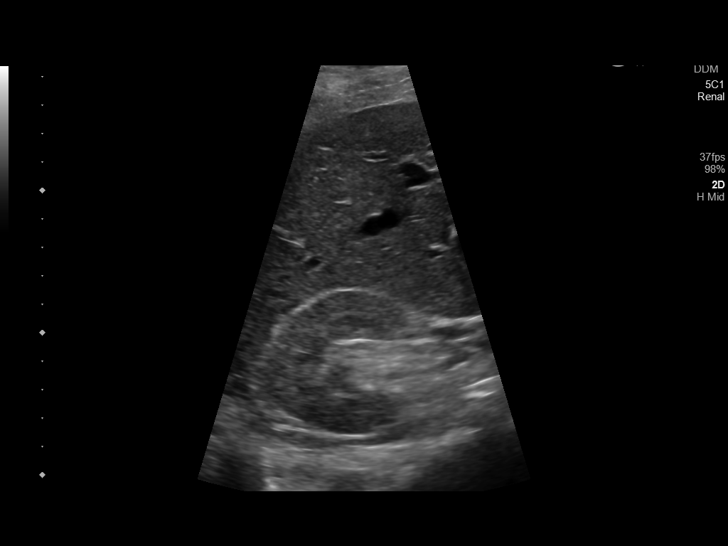
[im 13/34]
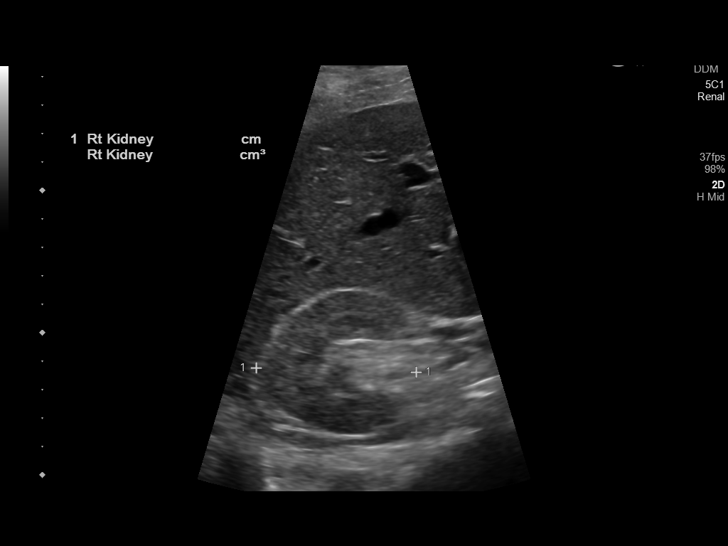
[im 16/34]
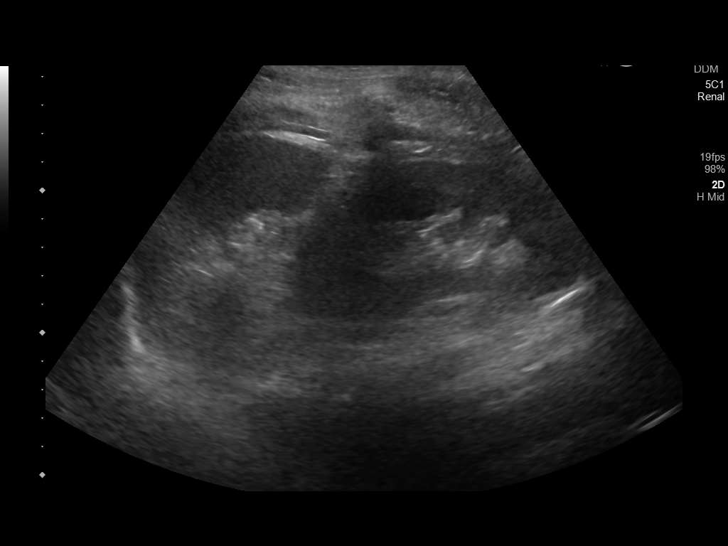
[im 18/34]
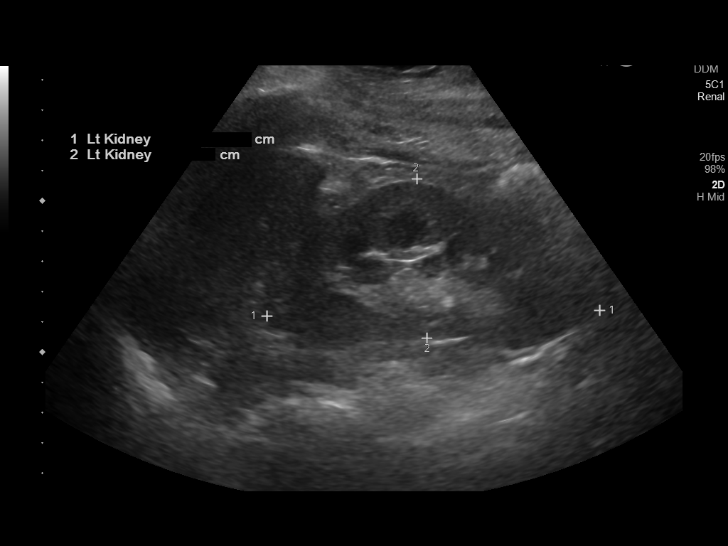
[im 21/34]
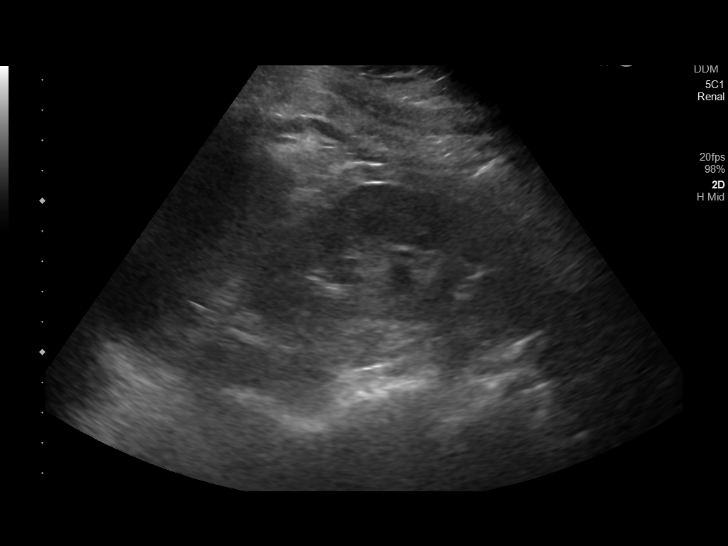
[im 23/34]
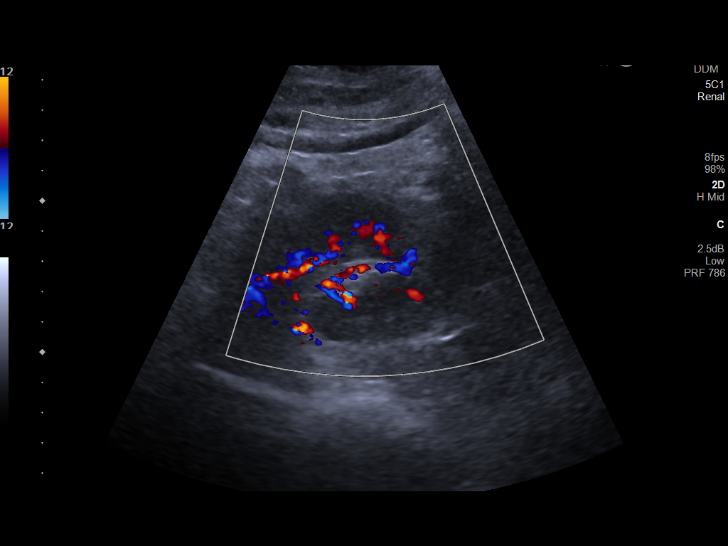
[im 25/34]
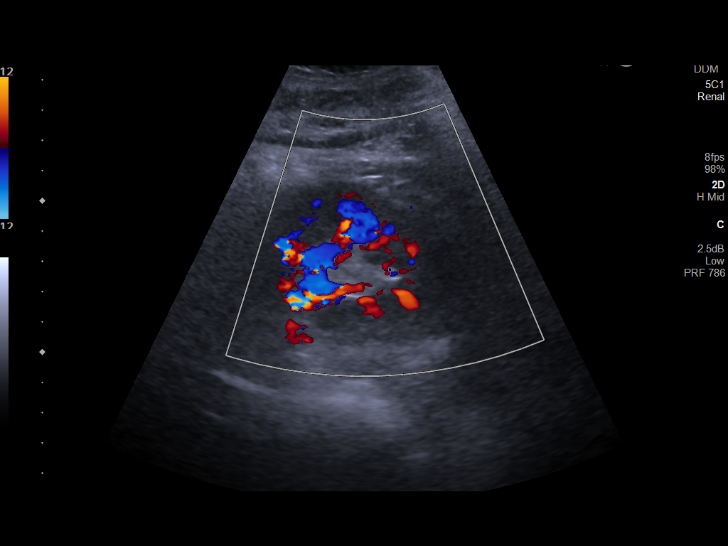
[im 28/34]
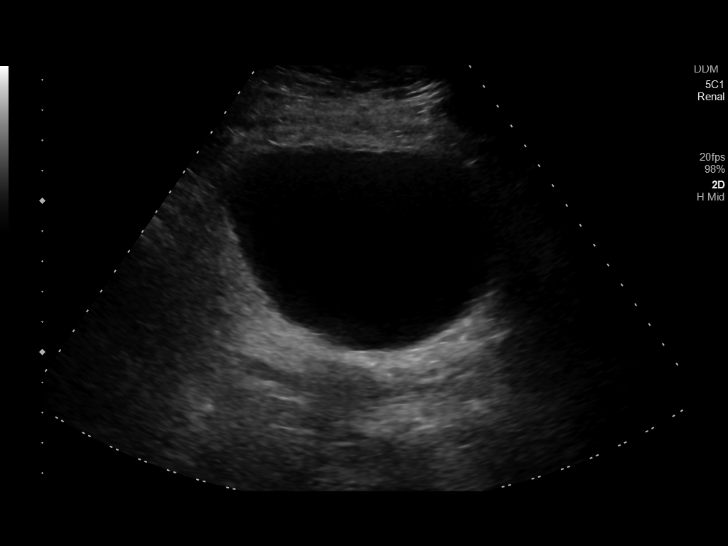
[im 31/34]
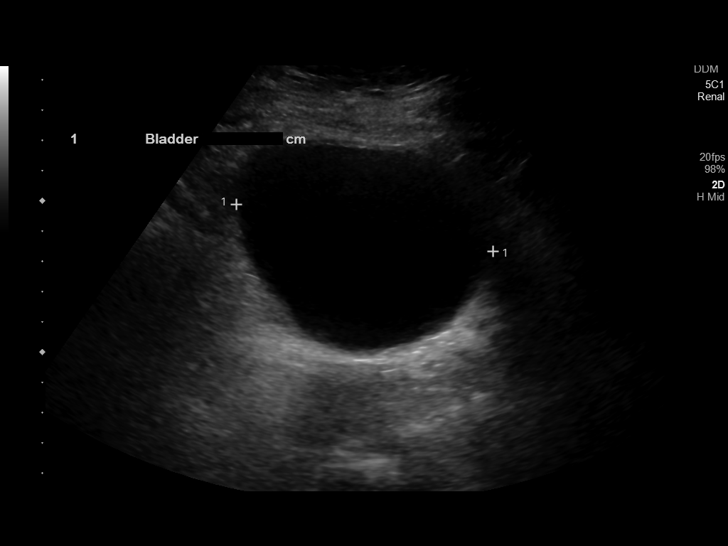
[im 34/34]
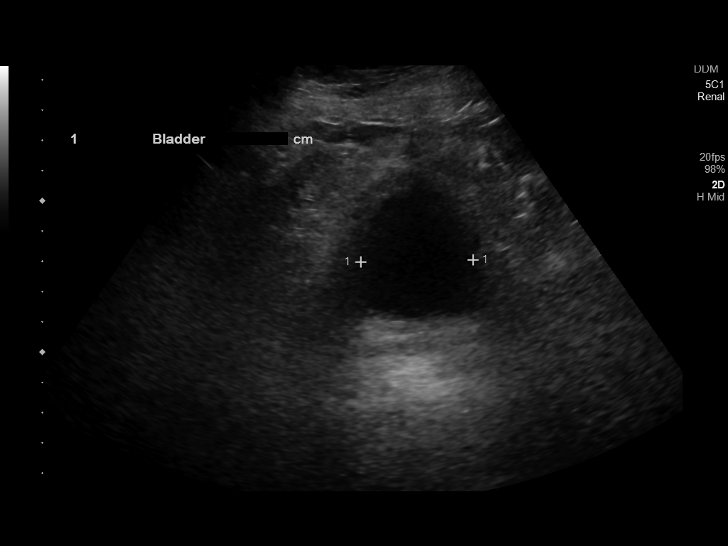

[14 of 25 positions shown; findings below may reference images not displayed]

FINDINGS: Right Kidney:

Renal measurements: 9.7 x 4.6 x 5.6 cm = volume: 132 mL. Renal
cortical thickness is preserved. Cortical echogenicity, however, is
diffusely mildly increased suggesting changes of underlying medical
renal disease. No hydronephrosis. No intrarenal masses or
calcifications are seen.

Left Kidney:

Renal measurements: 11.0 x 5.3 x 5.2 cm = volume: 197 mL. Renal
cortical echogenicity and cortical thickness is within normal
limits. No mass, calcification or hydronephrosis visualized.

Bladder:

Appears normal for degree of bladder distention. Small postvoid
residual of 20 cc is noted.

Other:

None.
IMPRESSION: Mildly, asymmetrically increased renal cortical echogenicity of the
right kidney suggesting changes of asymmetric medical renal disease.
Preserved cortical thickness. No hydronephrosis.

Small postvoid residual.
